# Patient Record
Sex: Female | Born: 1935 | Race: White | Hispanic: No | Smoking: Former smoker
Health system: Southern US, Community
[De-identification: ages and names within clinical notes are randomized; demographics above are authoritative.]

## PROBLEM LIST (undated history)

## (undated) DIAGNOSIS — I4891 Unspecified atrial fibrillation: Secondary | ICD-10-CM

## (undated) DIAGNOSIS — F0390 Unspecified dementia without behavioral disturbance: Secondary | ICD-10-CM

## (undated) DIAGNOSIS — I639 Cerebral infarction, unspecified: Secondary | ICD-10-CM

---

## 2018-01-26 ENCOUNTER — Emergency Department: Payer: Medicare Other

## 2018-01-26 ENCOUNTER — Inpatient Hospital Stay
Admission: EM | Admit: 2018-01-26 | Discharge: 2018-01-30 | DRG: 086 | Disposition: A | Discharge status: Skilled Nursing Facility | Payer: Medicare Other | Source: Skilled Nursing Facility | Attending: Specialist | Admitting: Specialist

## 2018-01-26 ENCOUNTER — Other Ambulatory Visit: Payer: Self-pay

## 2018-01-26 DIAGNOSIS — Z515 Encounter for palliative care: Secondary | ICD-10-CM | POA: Diagnosis not present

## 2018-01-26 DIAGNOSIS — I69351 Hemiplegia and hemiparesis following cerebral infarction affecting right dominant side: Secondary | ICD-10-CM

## 2018-01-26 DIAGNOSIS — F0280 Dementia in other diseases classified elsewhere without behavioral disturbance: Secondary | ICD-10-CM | POA: Diagnosis present

## 2018-01-26 DIAGNOSIS — W1830XA Fall on same level, unspecified, initial encounter: Secondary | ICD-10-CM | POA: Diagnosis present

## 2018-01-26 DIAGNOSIS — G309 Alzheimer's disease, unspecified: Secondary | ICD-10-CM | POA: Diagnosis present

## 2018-01-26 DIAGNOSIS — F419 Anxiety disorder, unspecified: Secondary | ICD-10-CM | POA: Diagnosis present

## 2018-01-26 DIAGNOSIS — Y92129 Unspecified place in nursing home as the place of occurrence of the external cause: Secondary | ICD-10-CM

## 2018-01-26 DIAGNOSIS — E785 Hyperlipidemia, unspecified: Secondary | ICD-10-CM | POA: Diagnosis present

## 2018-01-26 DIAGNOSIS — S06360A Traumatic hemorrhage of cerebrum, unspecified, without loss of consciousness, initial encounter: Principal | ICD-10-CM | POA: Diagnosis present

## 2018-01-26 DIAGNOSIS — S0101XA Laceration without foreign body of scalp, initial encounter: Secondary | ICD-10-CM | POA: Diagnosis present

## 2018-01-26 DIAGNOSIS — E440 Moderate protein-calorie malnutrition: Secondary | ICD-10-CM

## 2018-01-26 DIAGNOSIS — F329 Major depressive disorder, single episode, unspecified: Secondary | ICD-10-CM | POA: Diagnosis present

## 2018-01-26 DIAGNOSIS — I615 Nontraumatic intracerebral hemorrhage, intraventricular: Secondary | ICD-10-CM | POA: Diagnosis not present

## 2018-01-26 DIAGNOSIS — R531 Weakness: Secondary | ICD-10-CM

## 2018-01-26 DIAGNOSIS — I480 Paroxysmal atrial fibrillation: Secondary | ICD-10-CM | POA: Diagnosis present

## 2018-01-26 DIAGNOSIS — Z7901 Long term (current) use of anticoagulants: Secondary | ICD-10-CM

## 2018-01-26 DIAGNOSIS — I10 Essential (primary) hypertension: Secondary | ICD-10-CM | POA: Diagnosis present

## 2018-01-26 DIAGNOSIS — S0990XA Unspecified injury of head, initial encounter: Secondary | ICD-10-CM

## 2018-01-26 DIAGNOSIS — I4891 Unspecified atrial fibrillation: Secondary | ICD-10-CM

## 2018-01-26 DIAGNOSIS — Z66 Do not resuscitate: Secondary | ICD-10-CM | POA: Diagnosis present

## 2018-01-26 DIAGNOSIS — Z79899 Other long term (current) drug therapy: Secondary | ICD-10-CM

## 2018-01-26 DIAGNOSIS — Z7189 Other specified counseling: Secondary | ICD-10-CM | POA: Diagnosis not present

## 2018-01-26 DIAGNOSIS — I619 Nontraumatic intracerebral hemorrhage, unspecified: Secondary | ICD-10-CM | POA: Diagnosis present

## 2018-01-26 DIAGNOSIS — Z7952 Long term (current) use of systemic steroids: Secondary | ICD-10-CM

## 2018-01-26 HISTORY — DX: Unspecified atrial fibrillation: I48.91

## 2018-01-26 HISTORY — DX: Unspecified dementia, unspecified severity, without behavioral disturbance, psychotic disturbance, mood disturbance, and anxiety: F03.90

## 2018-01-26 HISTORY — DX: Cerebral infarction, unspecified: I63.9

## 2018-01-26 LAB — URINALYSIS, COMPLETE (UACMP) WITH MICROSCOPIC
BILIRUBIN URINE: NEGATIVE
Bacteria, UA: NONE SEEN
GLUCOSE, UA: NEGATIVE mg/dL
HGB URINE DIPSTICK: NEGATIVE
KETONES UR: NEGATIVE mg/dL
LEUKOCYTES UA: NEGATIVE
Nitrite: NEGATIVE
Protein, ur: NEGATIVE mg/dL
Specific Gravity, Urine: 1.006 (ref 1.005–1.030)
Squamous Epithelial / LPF: NONE SEEN (ref 0–5)
pH: 7 (ref 5.0–8.0)

## 2018-01-26 LAB — COMPREHENSIVE METABOLIC PANEL
ALT: 19 U/L (ref 0–44)
AST: 26 U/L (ref 15–41)
Albumin: 3.6 g/dL (ref 3.5–5.0)
Alkaline Phosphatase: 95 U/L (ref 38–126)
Anion gap: 10 (ref 5–15)
BUN: 16 mg/dL (ref 8–23)
CHLORIDE: 100 mmol/L (ref 98–111)
CO2: 32 mmol/L (ref 22–32)
Calcium: 9.3 mg/dL (ref 8.9–10.3)
Creatinine, Ser: 0.71 mg/dL (ref 0.44–1.00)
GFR calc non Af Amer: >60 mL/min (ref >60)
Glucose, Bld: 119 mg/dL — ABNORMAL HIGH (ref 70–99)
POTASSIUM: 3.9 mmol/L (ref 3.5–5.1)
SODIUM: 142 mmol/L (ref 135–145)
Total Bilirubin: 0.9 mg/dL (ref 0.3–1.2)
Total Protein: 7 g/dL (ref 6.5–8.1)

## 2018-01-26 LAB — CBC
HCT: 39 % (ref 35.0–47.0)
Hemoglobin: 13.2 g/dL (ref 12.0–16.0)
MCH: 28.8 pg (ref 26.0–34.0)
MCHC: 33.8 g/dL (ref 32.0–36.0)
MCV: 85.2 fL (ref 80.0–100.0)
PLATELETS: 193 10*3/uL (ref 150–440)
RBC: 4.58 MIL/uL (ref 3.80–5.20)
RDW: 14.8 % — AB (ref 11.5–14.5)
WBC: 8.6 10*3/uL (ref 3.6–11.0)

## 2018-01-26 LAB — TROPONIN I: TROPONIN I: 0.03 ng/mL — AB (ref <0.03)

## 2018-01-26 MED ORDER — MEMANTINE HCL 5 MG PO TABS
10.0000 mg | ORAL_TABLET | Freq: Every day | ORAL | Status: DC
  Administered 2018-01-26 – 2018-01-29 (×3): 10 mg via ORAL
  Filled 2018-01-26 (×4): qty 2

## 2018-01-26 MED ORDER — CLONAZEPAM 0.5 MG PO TABS
1.5000 mg | ORAL_TABLET | ORAL | Status: DC
  Filled 2018-01-26: qty 3

## 2018-01-26 MED ORDER — ADULT MULTIVITAMIN W/MINERALS CH: 1.0000 | ORAL_TABLET | Freq: Every day | ORAL | Status: DC

## 2018-01-26 MED ORDER — CITALOPRAM HYDROBROMIDE 10 MG PO TABS
15.0000 mg | ORAL_TABLET | Freq: Every day | ORAL | Status: DC
  Administered 2018-01-26 – 2018-01-30 (×4): 15 mg via ORAL
  Filled 2018-01-26 (×5): qty 2

## 2018-01-26 MED ORDER — ACETAMINOPHEN 650 MG RE SUPP: 650.0000 mg | Freq: Four times a day (QID) | RECTAL | Status: DC | PRN

## 2018-01-26 MED ORDER — DONEPEZIL HCL 5 MG PO TABS
5.0000 mg | ORAL_TABLET | Freq: Every day | ORAL | Status: DC
  Administered 2018-01-26 – 2018-01-29 (×3): 5 mg via ORAL
  Filled 2018-01-26 (×5): qty 1

## 2018-01-26 MED ORDER — METOPROLOL TARTRATE 25 MG PO TABS
25.0000 mg | ORAL_TABLET | Freq: Two times a day (BID) | ORAL | Status: DC
  Administered 2018-01-26 – 2018-01-29 (×5): 25 mg via ORAL
  Filled 2018-01-26 (×8): qty 1

## 2018-01-26 MED ORDER — HYDROCODONE-ACETAMINOPHEN 5-325 MG PO TABS: 1.0000 | ORAL_TABLET | ORAL | Status: DC | PRN

## 2018-01-26 MED ORDER — SODIUM CHLORIDE 0.9% FLUSH: 3.0000 mL | INTRAVENOUS | Status: DC | PRN

## 2018-01-26 MED ORDER — SODIUM CHLORIDE 0.9% FLUSH
3.0000 mL | Freq: Two times a day (BID) | INTRAVENOUS | Status: DC
  Administered 2018-01-27 – 2018-01-30 (×7): 3 mL via INTRAVENOUS

## 2018-01-26 MED ORDER — ACETAMINOPHEN 325 MG PO TABS: 650.0000 mg | ORAL_TABLET | Freq: Four times a day (QID) | ORAL | Status: DC | PRN

## 2018-01-26 MED ORDER — AMMONIUM LACTATE 12 % EX CREA
TOPICAL_CREAM | Freq: Two times a day (BID) | CUTANEOUS | Status: DC
  Filled 2018-01-26: qty 140

## 2018-01-26 MED ORDER — ATORVASTATIN CALCIUM 20 MG PO TABS
40.0000 mg | ORAL_TABLET | Freq: Every day | ORAL | Status: DC
  Administered 2018-01-26 – 2018-01-29 (×3): 40 mg via ORAL
  Filled 2018-01-26 (×4): qty 2

## 2018-01-26 MED ORDER — SODIUM CHLORIDE 0.9% FLUSH
3.0000 mL | Freq: Two times a day (BID) | INTRAVENOUS | Status: DC
  Administered 2018-01-26 – 2018-01-28 (×4): 3 mL via INTRAVENOUS

## 2018-01-26 MED ORDER — DOCUSATE SODIUM 100 MG PO CAPS
100.0000 mg | ORAL_CAPSULE | Freq: Two times a day (BID) | ORAL | Status: DC
  Administered 2018-01-26 – 2018-01-30 (×6): 100 mg via ORAL
  Filled 2018-01-26 (×8): qty 1

## 2018-01-26 MED ORDER — ONDANSETRON HCL 4 MG PO TABS: 4.0000 mg | ORAL_TABLET | Freq: Four times a day (QID) | ORAL | Status: DC | PRN

## 2018-01-26 MED ORDER — TRIAMCINOLONE ACETONIDE 0.1 % EX CREA
1.0000 "application " | TOPICAL_CREAM | Freq: Two times a day (BID) | CUTANEOUS | Status: DC
  Administered 2018-01-26 – 2018-01-29 (×6): 1 via TOPICAL
  Filled 2018-01-26: qty 15

## 2018-01-26 MED ORDER — CALAMINE EX LOTN
1.0000 "application " | TOPICAL_LOTION | CUTANEOUS | Status: DC
  Administered 2018-01-26: 1 via TOPICAL
  Filled 2018-01-26: qty 177

## 2018-01-26 MED ORDER — ADULT MULTIVITAMIN W/MINERALS CH
1.0000 | ORAL_TABLET | Freq: Every day | ORAL | Status: DC
  Administered 2018-01-26 – 2018-01-30 (×4): 1 via ORAL
  Filled 2018-01-26 (×5): qty 1

## 2018-01-26 MED ORDER — SODIUM CHLORIDE 0.9 % IV SOLN
INTRAVENOUS | Status: DC | PRN
  Administered 2018-01-26: 10:00:00 via INTRAVENOUS

## 2018-01-26 MED ORDER — AMMONIUM LACTATE 12 % EX LOTN
TOPICAL_LOTION | Freq: Two times a day (BID) | CUTANEOUS | Status: DC
  Administered 2018-01-26 – 2018-01-29 (×6): via TOPICAL
  Filled 2018-01-26: qty 400

## 2018-01-26 MED ORDER — SODIUM CHLORIDE 0.9 % IV SOLN: 250.0000 mL | INTRAVENOUS | Status: DC | PRN

## 2018-01-26 MED ORDER — PROTHROMBIN COMPLEX CONC HUMAN 1000 UNITS IV KIT
3226.0000 [IU] | PACK | Status: AC
  Administered 2018-01-26: 3226 [IU] via INTRAVENOUS
  Filled 2018-01-26 (×2): qty 3226

## 2018-01-26 MED ORDER — ONDANSETRON HCL 4 MG/2ML IJ SOLN: 4.0000 mg | Freq: Four times a day (QID) | INTRAMUSCULAR | Status: DC | PRN

## 2018-01-26 MED ORDER — FUROSEMIDE 20 MG PO TABS
20.0000 mg | ORAL_TABLET | Freq: Two times a day (BID) | ORAL | Status: DC
  Administered 2018-01-26 – 2018-01-30 (×6): 20 mg via ORAL
  Filled 2018-01-26 (×7): qty 1

## 2018-01-26 MED ORDER — POLYETHYLENE GLYCOL 3350 17 G PO PACK: 17.0000 g | PACK | Freq: Every day | ORAL | Status: DC | PRN

## 2018-01-26 MED ORDER — POTASSIUM CHLORIDE CRYS ER 10 MEQ PO TBCR
10.0000 meq | EXTENDED_RELEASE_TABLET | Freq: Every day | ORAL | Status: DC
  Administered 2018-01-26 – 2018-01-30 (×3): 10 meq via ORAL
  Filled 2018-01-26 (×4): qty 1

## 2018-01-26 MED ORDER — METOPROLOL TARTRATE 5 MG/5ML IV SOLN: 2.5000 mg | Freq: Four times a day (QID) | INTRAVENOUS | Status: DC | PRN

## 2018-01-26 NOTE — ED Provider Notes (Signed)
[facility] Emergency Department Provider Note   ____________________________________________    I have reviewed the triage vital signs and the nursing notes.   HISTORY  Chief Complaint Fall  History severely limited by dementia   HPI Amber Bruce is a 84 y.o. female with a history of dementia who presents after reported fall.  This was apparently unwitnessed.  No further history is available   No past medical history on file.  There are no active problems to display for this patient.     Prior to Admission medications   Medication Sig Start Date End Date Taking? Authorizing Provider  ammonium lactate (AMLACTIN) 12 % cream Apply topically 2 (two) times daily.   Yes [provider]  apixaban (ELIQUIS) 2.5 MG TABS tablet Take 2.5 mg by mouth 2 (two) times daily.   Yes [provider]  atorvastatin (LIPITOR) 40 MG tablet Take 40 mg by mouth at bedtime.   Yes [provider]  calamine lotion Apply 1 application topically 2 (two) times a week. On Monday and Friday; apply to back and arm   Yes [provider]  citalopram (CELEXA) 10 MG tablet Take 15 mg by mouth daily.   Yes [provider]  clonazePAM (KLONOPIN) 0.5 MG tablet Take 1.5 mg by mouth every Monday, Wednesday, and Friday. At 1400 (2 hours prior to showers)   Yes [provider]  donepezil (ARICEPT) 5 MG tablet Take 5 mg by mouth at bedtime.   Yes [provider]  furosemide (LASIX) 20 MG tablet Take 20 mg by mouth 2 (two) times daily. In the morning and afternoon   Yes [provider]  memantine (NAMENDA) 10 MG tablet Take 10 mg by mouth at bedtime.   Yes [provider]  metoprolol tartrate (LOPRESSOR) 25 MG tablet Take 25 mg by mouth every 12 (twelve) hours.   Yes [provider]  Multiple Vitamins-Minerals (CERTAVITE SENIOR/ANTIOXIDANT) TABS Take 1 tablet by mouth daily.   Yes [provider]    potassium chloride (K-DUR) 10 MEQ tablet Take 10 mEq by mouth daily.   Yes [provider]  triamcinolone cream (KENALOG) 0.1 % Apply 1 application topically 2 (two) times daily. Apply to affected areas on back and legs   Yes [provider]     Allergies Bee venom; Lisinopril; and Penicillins  No family history on file.  Social History Limited by dementia  Level 5 caveat: Unable to obtain review of Systems due to dementia     ____________________________________________   PHYSICAL EXAM:  VITAL SIGNS: ED Triage Vitals  Enc Vitals Group     BP 01/26/18 0647 (!) 160/92     Pulse Rate 01/26/18 0647 (!) 105     Resp 01/26/18 0647 16     Temp 01/26/18 0647 (!) 96.2 F (35.7 C)     Temp Source 01/26/18 0647 Axillary     SpO2 01/26/18 0647 96 %     Weight 01/26/18 0651 61.2 kg (135 lb)     Height --      Head Circumference --      Peak Flow --      Pain Score --      Pain Loc --      Pain Edu? --      Excl. in GC? --     Constitutional: Alert  Eyes: Conjunctivae are normal.  Head: Laceration to the posterior scalp, 3.5 cm long, bleeding controlled megaly intact Nose: No  swelling or epistaxis Mouth/Throat: Mucous membranes are moist.   Neck: No vertebral tenderness to palpation Cardiovascular: Tachycardia, regular rhythm.  Good peripheral circulation.  No chest wall tenderness to palpation, no bruising/abrasion Respiratory: Normal respiratory effort.  No retractions. Lungs CTAB. Gastrointestinal: Soft and nontender. No distention.    Musculoskeletal: Normal range of motion, passively, of the lower extremities and upper extremities.  No pain with axial load on both hips.  No pelvic tenderness to palpation.  Clavicles nontender, no vertebral tenderness. warm and well perfused extremities Neurologic: Moves all extremities Skin:  Skin is warm, dry and intact. No rash noted.   ____________________________________________   LABS (all labs ordered are  listed, but only abnormal results are displayed)  Labs Reviewed  CBC - Abnormal; Notable for the following components:      Result Value   RDW 14.8 (*)    All other components within normal limits  COMPREHENSIVE METABOLIC PANEL - Abnormal; Notable for the following components:   Glucose, Bld 119 (*)    All other components within normal limits  TROPONIN I - Abnormal; Notable for the following components:   Troponin I 0.03 (*)    All other components within normal limits  URINALYSIS, COMPLETE (UACMP) WITH MICROSCOPIC - Abnormal; Notable for the following components:   Color, Urine YELLOW (*)    APPearance CLEAR (*)    All other components within normal limits   ____________________________________________  EKG  ED ECG REPORT I, Jene Every, the attending physician, personally viewed and interpreted this ECG.  Date: 01/26/2018  Rate: 108 Rhythm: Appears consistent with atrial fibrillation QRS Axis: normal Intervals: Abnormal ST/T Wave abnormalities: Nonspecific changes   ____________________________________________  RADIOLOGY  Chest x-ray CT head and cervical spine ____________________________________________   PROCEDURES  Procedure(s) performed: yes  .Marland KitchenLaceration Repair Date/Time: 01/26/2018 2:36 PM Performed by: Jene Every, MD Authorized by: Jene Every, MD   Consent:    Consent obtained:  Verbal   Consent given by:  Patient   Risks discussed:  Infection and pain Anesthesia (see MAR for exact dosages):    Anesthesia method:  None Laceration details:    Location:  Scalp   Length (cm):  1 Repair type:    Repair type:  Simple Exploration:    Hemostasis achieved with:  Direct pressure   Wound exploration: entire depth of wound probed and visualized     Contaminated: no   Treatment:    Area cleansed with:  Saline   Amount of cleaning:  Standard   Irrigation solution:  Sterile saline   Visualized foreign bodies/material removed: no   Skin  repair:    Repair method:  Staples   Number of staples:  1 Approximation:    Approximation:  Close Post-procedure details:    Dressing:  Sterile dressing   Patient tolerance of procedure:  Tolerated well, no immediate complications     Critical Care performed: No ____________________________________________   INITIAL IMPRESSION / ASSESSMENT AND PLAN / ED COURSE  Pertinent labs & imaging results that were available during my care of the patient were reviewed by me and considered in my medical decision making (see chart for details).  Patient presents after unwitnessed fall.  No history available secondary to dementia.  Laceration to the scalp.  Tachycardia with mild hypothermia noted.  Will check labs, x-ray, urinalysis, CT head and cervical spine  ----------------------------------------- 8:50 AM on 01/26/2018 -----------------------------------------  Discussed with Dr. Marcell Barlow regarding intraventricular hemorrhage, he recommends family discussion given the patient is on Eliquis.  Recommends [facility] and repeat CT scan in 6 hours discussed with family and they agree with plan   ----------------------------------------- 2:35 PM on 01/26/2018 -----------------------------------------  Repeat CT scan unchanged.  1 cm laceration/hematoma closed with 1 staple Patient however is quite diffusely weak and unsafe for discharge at this time, neurologically intact, discussed with hospitalist for admission     ____________________________________________   FINAL CLINICAL IMPRESSION(S) / ED DIAGNOSES  Final diagnoses:  Injury of head, initial encounter  Intraventricular hemorrhage (HCC)  Generalized weakness  Atrial fibrillation, unspecified type [facility])        Note:  This document was prepared using Dragon voice recognition software and may include unintentional dictation errors.    Jene Every, MD 01/26/18 [phone]

## 2018-01-26 NOTE — ED Notes (Signed)
Pt leaving for CT now.  

## 2018-01-26 NOTE — ED Notes (Addendum)
4th call to floor to give report.

## 2018-01-26 NOTE — ED Notes (Signed)
Reported Trop level to [employer].

## 2018-01-26 NOTE — Progress Notes (Signed)
Pharmacy consult for post K[facility]/Feiba/Praxbind pharmacy monitoring noted. No specific monitoring recommended after Eliquis reversal. Some physicians will follow H&H. Pharmacy will sign off. Please re-consult if needed.  Aniayah Alaniz A. Larimore, [place].D., BCPS Clinical Pharmacist 01/26/2018 12:35

## 2018-01-26 NOTE — ED Notes (Signed)
No 2nd trop drawn per Dr Cyril Loosen

## 2018-01-26 NOTE — ED Notes (Signed)
Tiffany on floor received report.

## 2018-01-26 NOTE — ED Notes (Signed)
2nd attempt at report to floor. Charge states room is dirty so pt will get room 120 instead.

## 2018-01-26 NOTE — Consult Note (Signed)
Neurosurgery note  I was initially called by the ER earlier today regarding this patient, and recommended [facility] and repeating her scan. Based on my discussion with Dr. Cyril Loosen, the plan was to discharge the patient from the ER if the 2nd CT scan was stable. Based on this information, I expected her to be discharged back to her facility.  The patient was ultimately admitted to the hospitalist service.  The 2nd CT scan was stable, with scant intraventricular hemorrhage bilaterally.  No mass effect, midline shift, or any substantive lesion requiring neurosurgical intervention was noted.  Based on the patient history of advanced dementia, stable neurological exam, and family reticence to any invasive procedure, there is no situation in which neurosurgery intervention would be required.    I would not recommend neurosurgical intervention now or at any point based on the information at hand, given the patient's advanced age, medical issues, and dementia.  I defer additional decisions on her need for continued anticoagulation given fall risk to the hospitalist.  I would not recommend antiepileptics or any additional scans.    Please contact me if there are any questions or concerns.  Venetia Night MD Pager [phone]

## 2018-01-26 NOTE — Progress Notes (Signed)
Discussed this patient's situation with neurosurgeon Dr. Myer Haff.  After repeat head CT showed stable minimal bilateral hemorrhage, and given the fact that there would be no reasonable intervention in her case, due to age and baseline functional status and the small size of her bleed, neurosurgery consult was discontinued.  Dr. Myer Haff stated that he was more than happy to see this patient again if there is any change in her situation that warrants intervention or further recommendations on his part.  Kristeen Miss [facility] 01/26/2018, 10:07 PM

## 2018-01-26 NOTE — ED Notes (Signed)
Pt's head wound cleansed with peroxide/NS mix. Tolerated well.

## 2018-01-26 NOTE — ED Notes (Signed)
Pt will not attempt to answer all A&O Q's. Will state her name. Calm and cooperative otherwise. Does not complain of any pain.

## 2018-01-26 NOTE — ED Triage Notes (Signed)
Pt has fall this morning and has a lac to the back of her head. Pt has hx of dementia and will just look at you when asking questions. Unsure how fall occurred.

## 2018-01-26 NOTE — H&P (Signed)
[facility] - [place] at [facility]   PATIENT NAME: Amber Bruce    MR#:  [id number]  DATE OF BIRTH:  10/22/1923  DATE OF ADMISSION:  01/26/2018  PRIMARY CARE PHYSICIAN: Dione Housekeeper, MD   REQUESTING/REFERRING PHYSICIAN:   CHIEF COMPLAINT:   Chief Complaint  Patient presents with  . Fall    HISTORY OF PRESENT ILLNESS: Amber Bruce  is a 84 y.o. female with a known history per below which includes Alzheimer's disease, cerebrovascular accident with right hemiparesis, paroxysmal A. fib on Eliquis, status post unwitnessed fall at nursing facility hitting her head, patient brought into the emergency room for further evaluation/care, noted to have intracranial hemorrhage on CT of the head, patient received [facility] at the request of neurosurgery would repeat head CT which was noted for stable intraventricular hemorrhage, hospitalist asked to admit, patient evaluated at the bedside, patient is poor historian due to dementia, the patient's daughter is at the bedside, all questions answered, patient is now been admitted for acute intraventricular hemorrhage status post unwitnessed fall.  PAST MEDICAL HISTORY:   Dementia, paroxysmal A. fib, cerebrovascular accident with right hemiparesis, hypertension, chronic venous insufficiency  PAST SURGICAL HISTORY:  Joint replacement  SOCIAL HISTORY:  Social History   Tobacco Use  . Smoking status: Not on file  Substance Use Topics  . Alcohol use: Not on file    FAMILY HISTORY:  Diabetes  DRUG ALLERGIES:  Allergies  Allergen Reactions  . Bee Venom   . Lisinopril   . Penicillins     Has patient had a PCN reaction causing immediate rash, facial/tongue/throat swelling, SOB or lightheadedness with hypotension: Unknown Has patient had a PCN reaction causing severe rash involving mucus membranes or skin necrosis: Unknown Has patient had a PCN reaction that required hospitalization: Unknown Has patient had a PCN reaction  occurring within the last 10 years: Unknown If all of the above answers are "NO", then may proceed with Cephalosporin use.    REVIEW OF SYSTEMS: Unable to be obtained given dementia  CONSTITUTIONAL: No fever, fatigue or weakness.  EYES: No blurred or double vision.  EARS, NOSE, AND THROAT: No tinnitus or ear pain.  RESPIRATORY: No cough, shortness of breath, wheezing or hemoptysis.  CARDIOVASCULAR: No chest pain, orthopnea, edema.  GASTROINTESTINAL: No nausea, vomiting, diarrhea or abdominal pain.  GENITOURINARY: No dysuria, hematuria.  ENDOCRINE: No polyuria, nocturia,  HEMATOLOGY: No anemia, easy bruising or bleeding SKIN: No rash or lesion. MUSCULOSKELETAL: No joint pain or arthritis.   NEUROLOGIC: No tingling, numbness, weakness.  PSYCHIATRY: No anxiety or depression.   MEDICATIONS AT HOME:  Prior to Admission medications   Medication Sig Start Date End Date Taking? Authorizing Provider  ammonium lactate (AMLACTIN) 12 % cream Apply topically 2 (two) times daily.   Yes [provider]  apixaban (ELIQUIS) 2.5 MG TABS tablet Take 2.5 mg by mouth 2 (two) times daily.   Yes [provider]  atorvastatin (LIPITOR) 40 MG tablet Take 40 mg by mouth at bedtime.   Yes [provider]  calamine lotion Apply 1 application topically 2 (two) times a week. On Monday and Friday; apply to back and arm   Yes [provider]  citalopram (CELEXA) 10 MG tablet Take 15 mg by mouth daily.   Yes [provider]  clonazePAM (KLONOPIN) 0.5 MG tablet Take 1.5 mg by mouth every Monday, Wednesday, and Friday. At 1400 (2 hours prior to showers)   Yes [provider]  donepezil (ARICEPT) 5  MG tablet Take 5 mg by mouth at bedtime.   Yes [provider]  furosemide (LASIX) 20 MG tablet Take 20 mg by mouth 2 (two) times daily. In the morning and afternoon   Yes [provider]  memantine (NAMENDA) 10 MG tablet Take 10 mg by mouth at bedtime.    Yes [provider]  metoprolol tartrate (LOPRESSOR) 25 MG tablet Take 25 mg by mouth every 12 (twelve) hours.   Yes [provider]  Multiple Vitamins-Minerals (CERTAVITE SENIOR/ANTIOXIDANT) TABS Take 1 tablet by mouth daily.   Yes [provider]  potassium chloride (K-DUR) 10 MEQ tablet Take 10 mEq by mouth daily.   Yes [provider]  triamcinolone cream (KENALOG) 0.1 % Apply 1 application topically 2 (two) times daily. Apply to affected areas on back and legs   Yes [provider]      PHYSICAL EXAMINATION:   VITAL SIGNS: Blood pressure (!) 136/91, pulse 87, temperature (!) 96.2 F (35.7 C), temperature source Axillary, resp. rate 20, weight 61.2 kg, SpO2 97 %.  GENERAL:  84 y.o.-year-old patient lying in the bed with no acute distress.  Frail-appearing EYES: Pupils equal, round, reactive to light and accommodation. No scleral icterus. Extraocular muscles intact.  HEENT: Cephalohematoma with laceration status post stapling, normocephalic. Oropharynx and nasopharynx clear.  NECK:  Supple, no jugular venous distention. No thyroid enlargement, no tenderness.  LUNGS: Normal breath sounds bilaterally, no wheezing, rales,rhonchi or crepitation. No use of accessory muscles of respiration.  CARDIOVASCULAR: S1, S2 normal. No murmurs, rubs, or gallops.  ABDOMEN: Soft, nontender, nondistended. Bowel sounds present. No organomegaly or mass.  EXTREMITIES: No pedal edema, cyanosis, or clubbing.  NEUROLOGIC: Cranial nerves II through XII are intact.  Chronic right hemiparesis, confused and disoriented per baseline   PSYCHIATRIC: The patient is awake, alert, confused and disoriented per baseline  SKIN: No obvious rash, lesion, or ulcer.  Right cephalohematoma, scalp laceration status post stapling  LABORATORY PANEL:   CBC Recent Labs  Lab 01/26/18 0724  WBC 8.6  HGB 13.2  HCT 39.0  PLT 193  MCV 85.2  MCH 28.8  MCHC 33.8  RDW 14.8*    ------------------------------------------------------------------------------------------------------------------  Chemistries  Recent Labs  Lab 01/26/18 0724  NA 142  K 3.9  CL 100  CO2 32  GLUCOSE 119*  BUN 16  CREATININE 0.71  CALCIUM 9.3  AST 26  ALT 19  ALKPHOS 95  BILITOT 0.9   ------------------------------------------------------------------------------------------------------------------ CrCl cannot be calculated (Unknown ideal weight.). ------------------------------------------------------------------------------------------------------------------ No results for input(s): TSH, T4TOTAL, T3FREE, THYROIDAB in the last 72 hours.  Invalid input(s): FREET3   Coagulation profile No results for input(s): INR, PROTIME in the last 168 hours. ------------------------------------------------------------------------------------------------------------------- No results for input(s): DDIMER in the last 72 hours. -------------------------------------------------------------------------------------------------------------------  Cardiac Enzymes Recent Labs  Lab 01/26/18 0724  TROPONINI 0.03*   ------------------------------------------------------------------------------------------------------------------ Invalid input(s): POCBNP  ---------------------------------------------------------------------------------------------------------------  Urinalysis    Component Value Date/Time   COLORURINE YELLOW (A) 01/26/2018 0940   APPEARANCEUR CLEAR (A) 01/26/2018 0940   LABSPEC 1.006 01/26/2018 0940   PHURINE 7.0 01/26/2018 0940   GLUCOSEU NEGATIVE 01/26/2018 0940   HGBUR NEGATIVE 01/26/2018 0940   BILIRUBINUR NEGATIVE 01/26/2018 0940   KETONESUR NEGATIVE 01/26/2018 0940   PROTEINUR NEGATIVE 01/26/2018 0940   NITRITE NEGATIVE 01/26/2018 0940   LEUKOCYTESUR NEGATIVE 01/26/2018 0940     RADIOLOGY: Ct Head Wo Contrast  Result Date: 01/26/2018 CLINICAL DATA:   Recent fall with known intraventricular hemorrhage EXAM: CT HEAD WITHOUT CONTRAST TECHNIQUE:  Contiguous axial images were obtained from the base of the skull through the vertex without intravenous contrast. COMPARISON:  January 26, 2018 study performed earlier in the day. FINDINGS: Brain: Generalized atrophy is stable. There remain areas of hemorrhage in each posterior aspect left lateral ventricle, unchanged. There has been no new or progression of hemorrhage since earlier in the day. There is no mass. There is no extra-axial fluid collection or midline shift. There is no appreciable intraparenchymal hemorrhage. No well-defined hematoma evident. There is stable periventricular small vessel disease in the centra semiovale bilaterally. No acute infarct evident. Vascular: No hyperdense vessel evident. There is calcification in each carotid siphon region as well as slight calcification in distal vertebral arteries. Skull: Bony calvarium appears intact. There is a right parietal scalp hematoma containing foci of air. Sinuses/Orbits: There is mucosal thickening and opacification in several ethmoid air cells. There is opacification in the posterior left sphenoid sinus. Orbits appear symmetric bilaterally. Other: Mastoid air cells are clear. There is debris in each external auditory canal. IMPRESSION: Layering hemorrhage noted in each posterior aspect of the lateral ventricles, stable. No new or increasing foci of hemorrhage. No parenchymal hemorrhage in particular noted. Stable atrophy with periventricular small vessel disease. No acute infarct evident. No extra-axial fluid collection or midline shift. There are foci of arterial vascular calcification. There is a right parietal scalp hematoma containing air. No fracture evident. Foci of paranasal sinus disease noted. There is probable cerumen in each external auditory canal. Electronically Signed   By: Bretta Bang III M.D.   On: 01/26/2018 14:07   Ct Head Wo  Contrast  Result Date: 01/26/2018 CLINICAL DATA:  Head laceration after fall today. EXAM: CT HEAD WITHOUT CONTRAST CT CERVICAL SPINE WITHOUT CONTRAST TECHNIQUE: Multidetector CT imaging of the head and cervical spine was performed following the standard protocol without intravenous contrast. Multiplanar CT image reconstructions of the cervical spine were also generated. COMPARISON:  None. FINDINGS: CT HEAD FINDINGS Brain: Mild diffuse cortical atrophy is noted. Mild chronic ischemic white matter disease is noted. Small amount of blood is noted in the posterior horns of both lateral ventricles. No mass effect or midline shift is noted. No acute infarction or mass lesion is noted. Ventricular size is within normal limits given the degree of surrounding atrophy. Vascular: No hyperdense vessel or unexpected calcification. Skull: Normal. Negative for fracture or focal lesion. Sinuses/Orbits: Mild left sphenoid sinusitis is noted. Other: Small right posterior scalp hematoma is noted. CT CERVICAL SPINE FINDINGS Alignment: Mild grade 1 anterolisthesis of C3-4 and C4-5 is noted secondary to posterior facet joint hypertrophy. Skull base and vertebrae: No acute fracture. No primary bone lesion or focal pathologic process. Soft tissues and spinal canal: No prevertebral fluid or swelling. No visible canal hematoma. Disc levels: Severe degenerative disc disease is noted at C5-6 and C6-7 with anterior osteophyte formation. Upper chest: Negative. Other: Degenerative changes are seen involving posterior facet joints bilaterally. IMPRESSION: Mild diffuse cortical atrophy. Mild chronic ischemic white matter disease. Small right posterior scalp hematoma is noted. Small amount of intraventricular hemorrhage is noted in the posterior horns of both lateral ventricles. Critical Value/emergent results were called by telephone at the time of interpretation on 01/26/2018 at 8:22 am to Dr. Jene Every , who verbally acknowledged these  results. Multilevel degenerative disc disease is noted in the cervical spine. No fracture or other acute abnormality is noted. Electronically Signed   By: Lupita Raider, M.D.   On: 01/26/2018 08:23   Ct Cervical  Spine Wo Contrast  Result Date: 01/26/2018 CLINICAL DATA:  Head laceration after fall today. EXAM: CT HEAD WITHOUT CONTRAST CT CERVICAL SPINE WITHOUT CONTRAST TECHNIQUE: Multidetector CT imaging of the head and cervical spine was performed following the standard protocol without intravenous contrast. Multiplanar CT image reconstructions of the cervical spine were also generated. COMPARISON:  None. FINDINGS: CT HEAD FINDINGS Brain: Mild diffuse cortical atrophy is noted. Mild chronic ischemic white matter disease is noted. Small amount of blood is noted in the posterior horns of both lateral ventricles. No mass effect or midline shift is noted. No acute infarction or mass lesion is noted. Ventricular size is within normal limits given the degree of surrounding atrophy. Vascular: No hyperdense vessel or unexpected calcification. Skull: Normal. Negative for fracture or focal lesion. Sinuses/Orbits: Mild left sphenoid sinusitis is noted. Other: Small right posterior scalp hematoma is noted. CT CERVICAL SPINE FINDINGS Alignment: Mild grade 1 anterolisthesis of C3-4 and C4-5 is noted secondary to posterior facet joint hypertrophy. Skull base and vertebrae: No acute fracture. No primary bone lesion or focal pathologic process. Soft tissues and spinal canal: No prevertebral fluid or swelling. No visible canal hematoma. Disc levels: Severe degenerative disc disease is noted at C5-6 and C6-7 with anterior osteophyte formation. Upper chest: Negative. Other: Degenerative changes are seen involving posterior facet joints bilaterally. IMPRESSION: Mild diffuse cortical atrophy. Mild chronic ischemic white matter disease. Small right posterior scalp hematoma is noted. Small amount of intraventricular hemorrhage is  noted in the posterior horns of both lateral ventricles. Critical Value/emergent results were called by telephone at the time of interpretation on 01/26/2018 at 8:22 am to Dr. Jene Every , who verbally acknowledged these results. Multilevel degenerative disc disease is noted in the cervical spine. No fracture or other acute abnormality is noted. Electronically Signed   By: Lupita Raider, M.D.   On: 01/26/2018 08:23   Dg Chest Port 1 View  Result Date: 01/26/2018 CLINICAL DATA:  Fall and weakness.  History of dementia. EXAM: PORTABLE CHEST 1 VIEW COMPARISON:  None. FINDINGS: The lungs are adequately inflated. The interstitial markings are coarse bilaterally. The cardiac silhouette is enlarged. The pulmonary vascularity is not engorged. There is calcification in the wall of the thoracic aorta. The mediastinum is normal in width. IMPRESSION: Bilateral mild interstitial prominence may reflect acute or chronic bronchitic changes. Cardiomegaly without pulmonary vascular congestion or pulmonary edema. No acute pneumonia. Thoracic aortic atherosclerosis. Electronically Signed   By: David  [place] M.D.   On: 01/26/2018 07:41    EKG: Orders placed or performed during the hospital encounter of 01/26/18  . EKG 12-Lead  . EKG 12-Lead  . EKG 12-Lead  . EKG 12-Lead    IMPRESSION AND PLAN: *Acute intraventricular hemorrhage Status post mechanical fall striking head, received prothrombin complex concentrate Admit to telemetry bed, neurosurgery following, neurochecks per routine, physical therapy/speech therapy to evaluate/treat, aspiration/fall/skin care precautions while in house, avoid antiplatelet/anticoagulant/NSAID medications, and continue close medical monitoring  *Chronic Alzheimer's disease with history of cerebrovascular accident Stable Continue Aricept, Namenda  *History of paroxysmal A. Fib Eliquis discontinued given intracranial hemorrhage Continue Lopressor  *History of cerebrovascular  accident with right hemiparesis Continue statin therapy  *Chronic benign essential hypertension Stable Continue current regiment  All the records are reviewed and case discussed with ED provider. Management plans discussed with the patient, family and they are in agreement.  CODE STATUS:full    TOTAL TIME TAKING CARE OF THIS PATIENT: 40 minutes.    Montell D Salary M.D  on 01/26/2018   Between 7am to 6pm - Pager - [phone]  After 6pm go to [email or web address] - password [email or web address]  [facility]  Office  [phone]  CC: Primary care physician; Dione Housekeeper, MD   Note: This dictation was prepared with Dragon dictation along with smaller phrase technology. Any transcriptional errors that result from this process are unintentional.

## 2018-01-26 NOTE — Progress Notes (Signed)
Family Meeting Note  Advance Directive:yes  Today a meeting took place with the Patient, daughter.  Patient is unable to participate due [id number] capacity dementia   The following clinical team members were present during this meeting:MD  The following were discussed:Patient's diagnosis: ICH, dementia, Patient's progosis: Unable to determine and Goals for treatment: Full Code  Additional follow-up to be provided: prn  Time spent during discussion:20 minutes  Bertrum Sol, MD

## 2018-01-27 DIAGNOSIS — Z515 Encounter for palliative care: Secondary | ICD-10-CM

## 2018-01-27 DIAGNOSIS — S0990XA Unspecified injury of head, initial encounter: Secondary | ICD-10-CM

## 2018-01-27 DIAGNOSIS — R531 Weakness: Secondary | ICD-10-CM

## 2018-01-27 DIAGNOSIS — I615 Nontraumatic intracerebral hemorrhage, intraventricular: Secondary | ICD-10-CM

## 2018-01-27 DIAGNOSIS — E440 Moderate protein-calorie malnutrition: Secondary | ICD-10-CM

## 2018-01-27 DIAGNOSIS — I4891 Unspecified atrial fibrillation: Secondary | ICD-10-CM

## 2018-01-27 DIAGNOSIS — Z7189 Other specified counseling: Secondary | ICD-10-CM

## 2018-01-27 DIAGNOSIS — Z66 Do not resuscitate: Secondary | ICD-10-CM

## 2018-01-27 MED ORDER — CLONAZEPAM 0.5 MG PO TABS: 1.5000 mg | ORAL_TABLET | Freq: Two times a day (BID) | ORAL | Status: DC | PRN

## 2018-01-27 MED ORDER — ENSURE ENLIVE PO LIQD
237.0000 mL | Freq: Two times a day (BID) | ORAL | Status: DC
  Administered 2018-01-27 – 2018-01-30 (×4): 237 mL via ORAL

## 2018-01-27 MED ORDER — CLONAZEPAM 0.5 MG PO TABS
1.5000 mg | ORAL_TABLET | Freq: Every day | ORAL | Status: DC | PRN
  Administered 2018-01-30: 14:00:00 1.5 mg via ORAL
  Filled 2018-01-27: qty 3

## 2018-01-27 NOTE — Progress Notes (Signed)
Amber Bruce, daughter, medical power of attorney does not want the patient taking the Clonazepam.  This medication is given at assisted living if needed for bathing.  It is scheduled on MWF for showers only.  Ms. Colon Branch is adamant about the patient not taking this medication.

## 2018-01-27 NOTE — Clinical Social Work Note (Signed)
Clinical Social Work Assessment  Patient Details  Name: Amber Bruce MRN: [id number] Date of Birth: 12-Dec-1923  Date of referral:  01/27/18               Reason for consult:  Facility Placement                Permission sought to share information with:  Chartered certified accountant granted to share information::  Yes, Verbal Permission Granted  Name::      Amber Bruce::   [name]   Relationship::     Contact Information:     Housing/Transportation Living arrangements for the past 2 months:  [place] of Information:  Adult Children Patient Interpreter Needed:  None Criminal Activity/Legal Involvement Pertinent to Current Situation/Hospitalization:  No - Comment as needed Significant Relationships:  Adult Children Lives with:  Facility Resident Do you feel safe going back to the place where you live?    Need for family participation in patient care:  Yes (Comment)  Care giving concerns:  Patient is a resident at [place].    Social Worker assessment / plan:  Holiday representative (CSW) reviewed chart and noted that patient is from [place]. CSW attempted to contact [facility] twice today and left a Advertising account executive for [employer] the Retail buyer and the Special educational needs teacher. PT is recommending SNF. Palliative care is recommending outpatient palliative. CSW met with patient and her daughter Amber Bruce [phone] and patient's son in law Amber Bruce were at bedside. Patient was asleep during assessment. Per daughter patient has been in the memory care unit for 11 months and has been at [facility] a total of 4.5 years. Per daughter patient was able to walk with a walker at baseline. CSW explained that patient will need a higher level of care like a SNF. CSW explained that medicare will pay for SNF if patient has a 3 night qualifying inpatient stay in the hospital and can  participate in PT. Patient was admitted to inpatient 01/26/18. Per daughter patient has long term care insurance as well. Per daughter patient has been to [facility] for rehab in 2016 and asked CSW to send a referral there. Daughter is agreeable to SNF search in [place]. FL2 complete and faxed out to [facility]. CSW will continue to follow and assist as needed.   Employment status:  Disabled (Comment on whether or not currently receiving Disability) Insurance information:  Medicare PT Recommendations:  [place] / Referral to community resources:  [place]  Patient/Family's Response to care:  Patient's daughter is agreeable to [employer].   Patient/Family's Understanding of and Emotional Response to Diagnosis, Current Treatment, and Prognosis:  Patient's daughter was very pleasant and thanked CSW for assistance.   Emotional Assessment Appearance:  Appears stated age Attitude/Demeanor/Rapport:  Unable to Assess Affect (typically observed):  Unable to Assess Orientation:  Oriented to Self, Fluctuating Orientation (Suspected and/or reported Sundowners) Alcohol / Substance use:  Not Applicable Psych involvement (Current and /or in the community):  No (Comment)  Discharge Needs  Concerns to be addressed:  Discharge Planning Concerns Readmission within the last 30 days:  No Current discharge risk:  Dependent with Mobility, Cognitively Impaired, Chronically ill Barriers to Discharge:  Continued Medical Work up   [employer], Veronia Beets, LCSW 01/27/2018, 5:13 PM

## 2018-01-27 NOTE — Progress Notes (Signed)
Initial Nutrition Assessment  DOCUMENTATION CODES:   Non-severe (moderate) malnutrition in context of chronic illness  INTERVENTION:   - Liberalize diet to Regular  - Continue MVI with minerals daily  NUTRITION DIAGNOSIS:   Moderate Malnutrition related to chronic illness (Alzheimer's disease) as evidenced by mild fat depletion, moderate fat depletion, mild muscle depletion, moderate muscle depletion.  GOAL:   Patient will meet greater than or equal to 90% of their needs  MONITOR:   PO intake, Labs, Weight trends, I & O's  REASON FOR ASSESSMENT:   Consult Assessment of nutrition requirement/status  ASSESSMENT:   84 year old female who presented to the ED on 10/3 with a laceration to her scalp after a mechanical fall. PMH significant for Alzheimer's disease, CVA with right hemiparesis, atrial fibrillation on Eliquis, and hypertension. CT of the head showed intracranial hemorrhage. Neurosurgical intervention was not recommended.  Spoke with pt's daughter at bedside. Pt was sleeping but awakened to voice and during NFPE. All of diet and weight history were obtained from pt's daughter.  Pt's daughter is concerned that pt's throat is dry which is why she was not able to eat much breakfast. Per pt's daughter, pt ate most of the scrambled eggs, a bite of toast, and all of the juice.  Pt's daughter reports that pt typically "eats good" and will eat anything. Pt resides in a memory care unit at a facility where she receives 3 meals daily. Pt's daughter is unsure how well pt eats at meals but knows that whenever she and her sister take the pt out to eat, pt eats 100% of entree and 100% of dessert.  Pt's daughter states that she has noticed that pt has been gradually losing weight over the past several months but is unsure of her UBW. Weight history in chart is limited. No weights recorded PTA.  Pt's daughter agrees with liberalizing pt's diet so that she is not restricted. Discussed  with MD who agrees. Pt does not like oral nutrition supplements. Per daughter, pt will not drink supplements if offered.  Pt's daughter denies pt having any issues chewing or swallowing.  Meal Completion: 25%  Medications reviewed and include: Colace 100 mg BID, Lasix 20 mg BID, MVI with minerals daily, K-dur 10 mEq daily  Labs reviewed.  NUTRITION - FOCUSED PHYSICAL EXAM:    Most Recent Value  Orbital Region  Mild depletion  Upper Arm Region  No depletion  Thoracic and Lumbar Region  Moderate depletion  Buccal Region  No depletion  Temple Region  Mild depletion  Clavicle Bone Region  Moderate depletion  Clavicle and Acromion Bone Region  Moderate depletion  Scapular Bone Region  Unable to assess  Dorsal Hand  Moderate depletion  Patellar Region  Mild depletion  Anterior Thigh Region  Mild depletion  Posterior Calf Region  Mild depletion  Edema (RD Assessment)  None  Hair  Reviewed  Eyes  Reviewed  Mouth  Reviewed  Skin  Reviewed  Nails  Reviewed       Diet Order:   Diet Order            Diet regular Room service appropriate? Yes; Fluid consistency: Thin  Diet effective now              EDUCATION NEEDS:   No education needs have been identified at this time  Skin:  Skin Assessment: Reviewed RN Assessment (abrasion to buttocks, skin tear to head)  Last BM:  unknown/PTA  Height:   Ht Readings from  Last 1 Encounters:  01/26/18 5' (1.524 m)    Weight:   Wt Readings from Last 1 Encounters:  01/26/18 55.7 kg    Ideal Body Weight:  45.45 kg  BMI:  Body mass index is 23.98 kg/m.  Estimated Nutritional Needs:   Kcal:  1400-1600  Protein:  70-85 grams  Fluid:  >/= 1.5 L    Earma Reading, MS, RD, LDN Inpatient Clinical Dietitian Pager: [phone] Weekend/After Hours: [phone]

## 2018-01-27 NOTE — Progress Notes (Signed)
[employer] - [place] [facility]   PATIENT NAME: Amber Bruce    MR#:  [id number]  DATE OF BIRTH:  1923-10-16  SUBJECTIVE:  CHIEF COMPLAINT:   Chief Complaint  Patient presents with  . Fall   -Patient is alert and pleasantly confused.  Family at bedside.  REVIEW OF SYSTEMS:  Review of Systems  Unable to perform ROS: Dementia    DRUG ALLERGIES:   Allergies  Allergen Reactions  . Bee Venom   . Lisinopril   . Penicillins     Has patient had a PCN reaction causing immediate rash, facial/tongue/throat swelling, SOB or lightheadedness with hypotension: Unknown Has patient had a PCN reaction causing severe rash involving mucus membranes or skin necrosis: Unknown Has patient had a PCN reaction that required hospitalization: Unknown Has patient had a PCN reaction occurring within the last 10 years: Unknown If all of the above answers are "NO", then may proceed with Cephalosporin use.    VITALS:  Blood pressure (!) 120/94, pulse 80, temperature 98.6 F (37 C), temperature source Oral, resp. rate 20, height 5' (1.524 m), weight 55.7 kg, SpO2 98 %.  PHYSICAL EXAMINATION:  Physical Exam  GENERAL:  84 y.o.-year-old patient lying in the bed with no acute distress.  EYES: Pupils equal, round, reactive to light and accommodation. No scleral icterus. Extraocular muscles intact.  HEENT: Head atraumatic, normocephalic. Oropharynx and nasopharynx clear.  NECK:  Supple, no jugular venous distention. No thyroid enlargement, no tenderness.  LUNGS: Normal breath sounds bilaterally, no wheezing, rales,rhonchi or crepitation. No use of accessory muscles of respiration. Decreased at the bases CARDIOVASCULAR: S1, S2 normal. No rubs, or gallops. 3/6 systolic murmur present ABDOMEN: Soft, nontender, nondistended. Bowel sounds present. No organomegaly or mass.  EXTREMITIES: No  cyanosis, or clubbing. 1+ pedal edema noted. NEUROLOGIC: Cranial nerves intact. Moving left sided  extremities in bed. Weak on right side.  sensation intact. Gait not checked.  PSYCHIATRIC: The patient is alert, not very conversational SKIN: No obvious rash, lesion, or ulcer.    LABORATORY PANEL:   CBC Recent Labs  Lab 01/26/18 0724  WBC 8.6  HGB 13.2  HCT 39.0  PLT 193   ------------------------------------------------------------------------------------------------------------------  Chemistries  Recent Labs  Lab 01/26/18 0724  NA 142  K 3.9  CL 100  CO2 32  GLUCOSE 119*  BUN 16  CREATININE 0.71  CALCIUM 9.3  AST 26  ALT 19  ALKPHOS 95  BILITOT 0.9   ------------------------------------------------------------------------------------------------------------------  Cardiac Enzymes Recent Labs  Lab 01/26/18 0724  TROPONINI 0.03*   ------------------------------------------------------------------------------------------------------------------  RADIOLOGY:  Ct Head Wo Contrast  Result Date: 01/26/2018 CLINICAL DATA:  Recent fall with known intraventricular hemorrhage EXAM: CT HEAD WITHOUT CONTRAST TECHNIQUE: Contiguous axial images were obtained from the base of the skull through the vertex without intravenous contrast. COMPARISON:  January 26, 2018 study performed earlier in the day. FINDINGS: Brain: Generalized atrophy is stable. There remain areas of hemorrhage in each posterior aspect left lateral ventricle, unchanged. There has been no new or progression of hemorrhage since earlier in the day. There is no mass. There is no extra-axial fluid collection or midline shift. There is no appreciable intraparenchymal hemorrhage. No well-defined hematoma evident. There is stable periventricular small vessel disease in the centra semiovale bilaterally. No acute infarct evident. Vascular: No hyperdense vessel evident. There is calcification in each carotid siphon region as well as slight calcification in distal vertebral arteries. Skull: Bony calvarium appears intact. There  is a right parietal  scalp hematoma containing foci of air. Sinuses/Orbits: There is mucosal thickening and opacification in several ethmoid air cells. There is opacification in the posterior left sphenoid sinus. Orbits appear symmetric bilaterally. Other: Mastoid air cells are clear. There is debris in each external auditory canal. IMPRESSION: Layering hemorrhage noted in each posterior aspect of the lateral ventricles, stable. No new or increasing foci of hemorrhage. No parenchymal hemorrhage in particular noted. Stable atrophy with periventricular small vessel disease. No acute infarct evident. No extra-axial fluid collection or midline shift. There are foci of arterial vascular calcification. There is a right parietal scalp hematoma containing air. No fracture evident. Foci of paranasal sinus disease noted. There is probable cerumen in each external auditory canal. Electronically Signed   By: Bretta Bang III M.D.   On: 01/26/2018 14:07   Ct Head Wo Contrast  Result Date: 01/26/2018 CLINICAL DATA:  Head laceration after fall today. EXAM: CT HEAD WITHOUT CONTRAST CT CERVICAL SPINE WITHOUT CONTRAST TECHNIQUE: Multidetector CT imaging of the head and cervical spine was performed following the standard protocol without intravenous contrast. Multiplanar CT image reconstructions of the cervical spine were also generated. COMPARISON:  None. FINDINGS: CT HEAD FINDINGS Brain: Mild diffuse cortical atrophy is noted. Mild chronic ischemic white matter disease is noted. Small amount of blood is noted in the posterior horns of both lateral ventricles. No mass effect or midline shift is noted. No acute infarction or mass lesion is noted. Ventricular size is within normal limits given the degree of surrounding atrophy. Vascular: No hyperdense vessel or unexpected calcification. Skull: Normal. Negative for fracture or focal lesion. Sinuses/Orbits: Mild left sphenoid sinusitis is noted. Other: Small right posterior scalp  hematoma is noted. CT CERVICAL SPINE FINDINGS Alignment: Mild grade 1 anterolisthesis of C3-4 and C4-5 is noted secondary to posterior facet joint hypertrophy. Skull base and vertebrae: No acute fracture. No primary bone lesion or focal pathologic process. Soft tissues and spinal canal: No prevertebral fluid or swelling. No visible canal hematoma. Disc levels: Severe degenerative disc disease is noted at C5-6 and C6-7 with anterior osteophyte formation. Upper chest: Negative. Other: Degenerative changes are seen involving posterior facet joints bilaterally. IMPRESSION: Mild diffuse cortical atrophy. Mild chronic ischemic white matter disease. Small right posterior scalp hematoma is noted. Small amount of intraventricular hemorrhage is noted in the posterior horns of both lateral ventricles. Critical Value/emergent results were called by telephone at the time of interpretation on 01/26/2018 at 8:22 am to Dr. Jene Every , who verbally acknowledged these results. Multilevel degenerative disc disease is noted in the cervical spine. No fracture or other acute abnormality is noted. Electronically Signed   By: Lupita Raider, M.D.   On: 01/26/2018 08:23   Ct Cervical Spine Wo Contrast  Result Date: 01/26/2018 CLINICAL DATA:  Head laceration after fall today. EXAM: CT HEAD WITHOUT CONTRAST CT CERVICAL SPINE WITHOUT CONTRAST TECHNIQUE: Multidetector CT imaging of the head and cervical spine was performed following the standard protocol without intravenous contrast. Multiplanar CT image reconstructions of the cervical spine were also generated. COMPARISON:  None. FINDINGS: CT HEAD FINDINGS Brain: Mild diffuse cortical atrophy is noted. Mild chronic ischemic white matter disease is noted. Small amount of blood is noted in the posterior horns of both lateral ventricles. No mass effect or midline shift is noted. No acute infarction or mass lesion is noted. Ventricular size is within normal limits given the degree of  surrounding atrophy. Vascular: No hyperdense vessel or unexpected calcification. Skull: Normal. Negative for fracture or  focal lesion. Sinuses/Orbits: Mild left sphenoid sinusitis is noted. Other: Small right posterior scalp hematoma is noted. CT CERVICAL SPINE FINDINGS Alignment: Mild grade 1 anterolisthesis of C3-4 and C4-5 is noted secondary to posterior facet joint hypertrophy. Skull base and vertebrae: No acute fracture. No primary bone lesion or focal pathologic process. Soft tissues and spinal canal: No prevertebral fluid or swelling. No visible canal hematoma. Disc levels: Severe degenerative disc disease is noted at C5-6 and C6-7 with anterior osteophyte formation. Upper chest: Negative. Other: Degenerative changes are seen involving posterior facet joints bilaterally. IMPRESSION: Mild diffuse cortical atrophy. Mild chronic ischemic white matter disease. Small right posterior scalp hematoma is noted. Small amount of intraventricular hemorrhage is noted in the posterior horns of both lateral ventricles. Critical Value/emergent results were called by telephone at the time of interpretation on 01/26/2018 at 8:22 am to Dr. Jene Every , who verbally acknowledged these results. Multilevel degenerative disc disease is noted in the cervical spine. No fracture or other acute abnormality is noted. Electronically Signed   By: Lupita Raider, M.D.   On: 01/26/2018 08:23   Dg Chest Port 1 View  Result Date: 01/26/2018 CLINICAL DATA:  Fall and weakness.  History of dementia. EXAM: PORTABLE CHEST 1 VIEW COMPARISON:  None. FINDINGS: The lungs are adequately inflated. The interstitial markings are coarse bilaterally. The cardiac silhouette is enlarged. The pulmonary vascularity is not engorged. There is calcification in the wall of the thoracic aorta. The mediastinum is normal in width. IMPRESSION: Bilateral mild interstitial prominence may reflect acute or chronic bronchitic changes. Cardiomegaly without pulmonary  vascular congestion or pulmonary edema. No acute pneumonia. Thoracic aortic atherosclerosis. Electronically Signed   By: David  [place] M.D.   On: 01/26/2018 07:41    EKG:   Orders placed or performed during the hospital encounter of 01/26/18  . EKG 12-Lead  . EKG 12-Lead  . EKG 12-Lead  . EKG 12-Lead    ASSESSMENT AND PLAN:   84 year old female from [facility] assisted living facility with past medical history significant for CVA, Alzheimer's dementia, paroxysmal A. fib on Eliquis, hypertension resents after a fall and noted to have intracranial hemorrhage.  1. Intracranial hemorrhage- bilateral lateral ventricles hemorrhage, after a fall and head trauma while on eliquis -Stable, no significant changes mental status wise -Appreciate neurosurgery input.  No intervention recommended -Repeat CT later today. -Physical therapy consult  2.  Alzheimer's disease-from assisted living facility.  Continue Aricept and Namenda  3.  Paroxysmal A. fib-continue Lopressor. -Eliquis discontinued due to intracranial hemorrhage  4.  DVT prophylaxis-teds and SCDs  Family updated at bedside.  Appreciate palliative care consult.  Plan is to discharge back to assisted living facility when stable   All the records are reviewed and case discussed with Care Management/Social Workerr. Management plans discussed with the patient, family and they are in agreement.  CODE STATUS: DNR  TOTAL TIME TAKING CARE OF THIS PATIENT: 37 minutes.   POSSIBLE D/C IN 1-2 DAYS, DEPENDING ON CLINICAL CONDITION.   Deslyn Cavenaugh M.D on 01/27/2018 at 2:50 PM  Between 7am to 6pm - Pager - [phone]  After 6pm go to [email or web address] - Social research officer, government  [facility]  Office  [phone]  CC: Primary care physician; Dione Housekeeper, MD
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
## 2018-01-27 NOTE — Plan of Care (Signed)

## 2018-01-27 NOTE — [place] FL2 (Signed)
[place] MEDICAID FL2 LEVEL OF CARE SCREENING TOOL     IDENTIFICATION  Patient Name: Amber Bruce Birthdate: [birth year] Sex: female Admission Date (Current Location): 01/26/2018  [place] and [place] Number:  Chiropodist and Address:  [facility], [address], [place], [place] [address]      Provider Number: [id number]  Attending Physician Name and Address:  Enid Baas, MD  Relative Name and Phone Number:       Current Level of Care: Hospital Recommended Level of Care: Skilled Nursing Facility Prior Approval Number:    Date Approved/Denied:   PASRR Number: [id number])  Discharge Plan: SNF    Current Diagnoses: Patient Active Problem List   Diagnosis Date Noted  . Malnutrition of moderate degree 01/27/2018  . ICH (intracerebral hemorrhage) ([facility]) 01/26/2018    Orientation RESPIRATION BLADDER Height & Weight     Self  Normal Continent Weight: 122 lb 12.7 oz (55.7 kg) Height:  5' (152.4 cm)  BEHAVIORAL SYMPTOMS/MOOD NEUROLOGICAL BOWEL NUTRITION STATUS      Continent Diet(Diet: Regular )  AMBULATORY STATUS COMMUNICATION OF NEEDS Skin   Extensive Assist Verbally Normal                       Personal Care Assistance Level of Assistance  Bathing, Feeding, Dressing Bathing Assistance: Limited assistance Feeding assistance: Limited assistance Dressing Assistance: Limited assistance     Functional Limitations Info  Sight, Hearing, Speech Sight Info: Adequate Hearing Info: Adequate Speech Info: Adequate    SPECIAL CARE FACTORS FREQUENCY  PT (By licensed PT), OT (By licensed OT)     PT Frequency: (5) OT Frequency: (5)            Contractures      Additional Factors Info  Code Status, Allergies Code Status Info: (DNR ) Allergies Info: (Bee Venom, Lisinopril, Penicillins)           Current Medications (01/27/2018):  This is the current hospital active medication list Current Facility-Administered  Medications  Medication Dose Route Frequency Provider Last Rate Last Dose  . 0.9 %  sodium chloride infusion   Intravenous PRN Jene Every, MD   Stopped at 01/26/18 1343  . 0.9 %  sodium chloride infusion  250 mL Intravenous PRN Salary, Montell D, MD      . acetaminophen (TYLENOL) tablet 650 mg  650 mg Oral Q6H PRN Salary, Montell D, MD       Or  . acetaminophen (TYLENOL) suppository 650 mg  650 mg Rectal Q6H PRN Salary, Montell D, MD      . ammonium lactate (LAC-HYDRIN) 12 % lotion   Topical BID Salary, Montell D, MD      . atorvastatin (LIPITOR) tablet 40 mg  40 mg Oral QHS Salary, Jetty Duhamel D, MD   40 mg at 01/26/18 2008  . calamine lotion 1 application  1 application Topical Once per day on Mon Thu Salary, Montell D, MD   1 application at 01/26/18 2007  . citalopram (CELEXA) tablet 15 mg  15 mg Oral Daily Salary, Montell D, MD   15 mg at 01/27/18 0950  . clonazePAM (KLONOPIN) tablet 1.5 mg  1.5 mg Oral Daily PRN Enid Baas, MD      . docusate sodium (COLACE) capsule 100 mg  100 mg Oral BID Angelina Ok D, MD   100 mg at 01/27/18 0950  . donepezil (ARICEPT) tablet 5 mg  5 mg Oral QHS Salary, Evelena Asa, MD  5 mg at 01/26/18 2009  . feeding supplement (ENSURE ENLIVE) (ENSURE ENLIVE) liquid 237 mL  237 mL Oral BID BM Enid Baas, MD   237 mL at 01/27/18 1443  . furosemide (LASIX) tablet 20 mg  20 mg Oral BID Angelina Ok D, MD   20 mg at 01/27/18 0950  . HYDROcodone-acetaminophen (NORCO/VICODIN) 5-325 MG per tablet 1-2 tablet  1-2 tablet Oral Q4H PRN Salary, Montell D, MD      . memantine (NAMENDA) tablet 10 mg  10 mg Oral QHS Salary, Montell D, MD   10 mg at 01/26/18 2008  . metoprolol tartrate (LOPRESSOR) injection 2.5 mg  2.5 mg Intravenous Q6H PRN Salary, Montell D, MD      . metoprolol tartrate (LOPRESSOR) tablet 25 mg  25 mg Oral Q12H Salary, Montell D, MD   25 mg at 01/27/18 0950  . multivitamin with minerals tablet 1 tablet  1 tablet Oral Daily Salary, Montell D, MD    1 tablet at 01/27/18 0950  . ondansetron (ZOFRAN) tablet 4 mg  4 mg Oral Q6H PRN Salary, Montell D, MD       Or  . ondansetron (ZOFRAN) injection 4 mg  4 mg Intravenous Q6H PRN Salary, Montell D, MD      . polyethylene glycol (MIRALAX / GLYCOLAX) packet 17 g  17 g Oral Daily PRN Salary, Montell D, MD      . potassium chloride (K-DUR,KLOR-CON) CR tablet 10 mEq  10 mEq Oral Daily Salary, Montell D, MD   10 mEq at 01/27/18 0950  . sodium chloride flush (NS) 0.9 % injection 3 mL  3 mL Intravenous Q12H Salary, Montell D, MD   3 mL at 01/27/18 0957  . sodium chloride flush (NS) 0.9 % injection 3 mL  3 mL Intravenous Q12H Salary, Montell D, MD   3 mL at 01/27/18 0956  . sodium chloride flush (NS) 0.9 % injection 3 mL  3 mL Intravenous PRN Salary, Montell D, MD      . triamcinolone cream (KENALOG) 0.1 % 1 application  1 application Topical BID Salary, Evelena Asa, MD   1 application at 01/27/18 1610     Discharge Medications: Please see discharge summary for a list of discharge medications.  Relevant Imaging Results:  Relevant Lab Results:   Additional Information (SSN: [id number])  Crisol Muecke, Darleen Crocker, LCSW

## 2018-01-27 NOTE — Progress Notes (Signed)
Physical Therapy Evaluation Patient Details Name: Amber Bruce MRN: [id number] DOB: 29-May-1923 Today's Date: 01/27/2018   History of Present Illness  On 01/26/2018 pt admitted to hospital for intracerebral hemorrhage s/p a fall. As of 10/4 per neurologist imaging shows hemorrhage is stable, with scant intraventricular hemorrhage bilaterally.  No mass effect, midline shift, or any substantive lesion requiring neurosurgical intervention was noted. Pt has PMH induling Alzheimer's diease, CVA with R hemiparesis, paroxysmal A-fib, HTN, and chronic venous insufficiency.  Clinical Impression  Pt is a pleasant 84 year old female who was admitted for intracerebral hemorrhage. Pt has dementia and is oriented only to person, hx obtained from daughter. Pt agreed to supine ther-ex and tolerated well. Pt performs bed mobility with Min A for rolling and Mod A for long sit. Pt refused participation in other mobility after multiple attempts by PT to encourage participation. Pt demonstrates deficits with cognition, strength, mobility, and balance. Pt is not at her baseline. Would benefit from skilled PT to address above deficits and promote optimal return to PLOF. Recommend OT consult for pt's return to PLOF with ADLs.      Follow Up Recommendations SNF    Equipment Recommendations  None recommended by PT    Recommendations for Other Services OT consult     Precautions / Restrictions Precautions Precautions: Fall Restrictions Weight Bearing Restrictions: No      Mobility  Bed Mobility Overal bed mobility: Needs Assistance Bed Mobility: Rolling Rolling: Min assist-Mod A         General bed mobility comments: Pt demonstrates greater ability roll onto her R side compared to L likely d/t hemiparesis on the R. She requires a lot of coaxing to participate. Also came to long sitting from supine with Mod A for trunk control. Did not progress to EOB d/t pt's refusal.  Transfers                  General transfer comment: Deferred to next tx d/t pt refusal to participate in activity.  Ambulation/Gait                Stairs            Wheelchair Mobility    Modified Rankin (Stroke Patients Only)       Balance Overall balance assessment: Needs assistance Sitting-balance support: Bilateral upper extremity supported;Feet supported Sitting balance-Leahy Scale: Poor Sitting balance - Comments: In long sit has dec trunk control. Postural control: Right lateral lean                                   Pertinent Vitals/Pain Pain Assessment: No/denies pain    Home Living Family/patient expects to be discharged to:: Skilled nursing facility               Home Equipment: Dan Humphreys - 2 wheels Additional Comments: Per daughter Macarthur Critchley) would like [facility].     Prior Function Level of Independence: Needs assistance   Gait / Transfers Assistance Needed: Mod I with mobility using RW for UE support for household distances.  ADL's / Homemaking Assistance Needed:  She require assist with dressing and grooming.  Comments: All hx obtained from pt's daughter d/t pt's cognitive status.     Hand Dominance        Extremity/Trunk Assessment   Upper Extremity Assessment Upper Extremity Assessment: Generalized weakness(RUE 2-/5, LUE 3/5)    Lower Extremity Assessment Lower Extremity Assessment: Generalized  weakness(BLE 3/5)       Communication   Communication: HOH  Cognition Arousal/Alertness: Awake/alert Behavior During Therapy: WFL for tasks assessed/performed Overall Cognitive Status: History of cognitive impairments - at baseline                                 General Comments: Pt is A&O x 1 (person). She is not combative or impulsive, but a little argumentative likely d/t being in an unfamilar surroundings.      General Comments      Exercises Other Exercises Other Exercises: Supine AROM ankle pumps, SLRs, hip  ABD/ADD, and heel slides. All ther-ex performed 5-8 reps with heavy VC for sequencing.   Assessment/Plan    PT Assessment Patient needs continued PT services  PT Problem List Decreased strength;Decreased activity tolerance;Decreased balance;Decreased mobility;Decreased cognition;Decreased knowledge of use of DME;Decreased safety awareness       PT Treatment Interventions DME instruction;Gait training;Functional mobility training;Therapeutic activities;Therapeutic exercise;Balance training;Neuromuscular re-education;Cognitive remediation;Patient/family education    PT Goals (Current goals can be found in the Care Plan section)  Acute Rehab PT Goals PT Goal Formulation: Patient unable to participate in goal setting Time For Goal Achievement: 02/10/18 Potential to Achieve Goals: Fair Additional Goals Additional Goal #1: Pt will demonstrate the ability to perform bed mobility and transfers with Min A to improve independence with mobility in the home.    Frequency Min 2X/week   Barriers to discharge        Co-evaluation               AM-PAC PT "6 Clicks" Daily Activity  Outcome Measure Difficulty turning over in bed (including adjusting bedclothes, sheets and blankets)?: Unable Difficulty moving from lying on back to sitting on the side of the bed? : Unable Difficulty sitting down on and standing up from a chair with arms (e.g., wheelchair, bedside commode, etc,.)?: Unable Help needed moving to and from a bed to chair (including a wheelchair)?: Total Help needed walking in hospital room?: Total Help needed climbing 3-5 steps with a railing? : Total 6 Click Score: 6    End of Session   Activity Tolerance: Patient tolerated treatment well;Other (comment)(Cognitive status limited ability to progress.) Patient left: in bed;with call bell/phone within reach;with bed alarm set;with family/visitor present;with nursing/sitter in room Nurse Communication: Mobility status PT Visit  Diagnosis: Unsteadiness on feet (R26.81);Muscle weakness (generalized) (M62.81);History of falling (Z91.81);Hemiplegia and hemiparesis Hemiplegia - Right/Left: Right Hemiplegia - caused by: Cerebral infarction    Time: 8295-6213 PT Time Calculation (min) (ACUTE ONLY): 24 min   Charges:              Arvilla Meres, SPT   Arvilla Meres 01/27/2018, 1:02 PM

## 2018-01-27 NOTE — Clinical Social Work Placement (Signed)
   CLINICAL SOCIAL WORK PLACEMENT  NOTE  Date:  01/27/2018  Patient Details  Name: Amber Bruce MRN: [id number] Date of Birth: 05/09/1923  Clinical Social Work is seeking post-discharge placement for this patient at the Skilled  Nursing Facility level of care (*CSW will initial, date and re-position this form in  chart as items are completed):  Yes   Patient/family provided with [facility] Clinical Social Work Department's list of facilities offering this level of care within the geographic area requested by the patient (or if unable, by the patient's family).  Yes   Patient/family informed of their freedom to choose among providers that offer the needed level of care, that participate in Medicare, Medicaid or managed care program needed by the patient, have an available bed and are willing to accept the patient.  Yes   Patient/family informed of [facility] ownership interest in [facility] and [facility], as well as of the fact that they are under no obligation to receive care at these facilities.  PASRR submitted to EDS on       PASRR number received on       Existing PASRR number confirmed on 01/27/18     FL2 transmitted to all facilities in geographic area requested by pt/family on 01/27/18     FL2 transmitted to all facilities within larger geographic area on       Patient informed that his/her managed care company has contracts with or will negotiate with certain facilities, including the following:            Patient/family informed of bed offers received.  Patient chooses bed at       Physician recommends and patient chooses bed at      Patient to be transferred to   on  .  Patient to be transferred to facility by       Patient family notified on   of transfer.  Name of family member notified:        PHYSICIAN       Additional Comment:    _______________________________________________ Hezzie Karim, Darleen Crocker, LCSW 01/27/2018, 5:12 PM

## 2018-01-28 LAB — BASIC METABOLIC PANEL
Anion gap: 8 (ref 5–15)
BUN: 14 mg/dL (ref 8–23)
CO2: 31 mmol/L (ref 22–32)
CREATININE: 0.73 mg/dL (ref 0.44–1.00)
Calcium: 8.7 mg/dL — ABNORMAL LOW (ref 8.9–10.3)
Chloride: 101 mmol/L (ref 98–111)
GFR calc Af Amer: >60 mL/min (ref >60)
GFR calc non Af Amer: >60 mL/min (ref >60)
Glucose, Bld: 126 mg/dL — ABNORMAL HIGH (ref 70–99)
POTASSIUM: 3.5 mmol/L (ref 3.5–5.1)
Sodium: 140 mmol/L (ref 135–145)

## 2018-01-28 LAB — CBC
HCT: 38.6 % (ref 35.0–47.0)
Hemoglobin: 12.4 g/dL (ref 12.0–16.0)
MCH: 27.7 pg (ref 26.0–34.0)
MCHC: 32.2 g/dL (ref 32.0–36.0)
MCV: 85.8 fL (ref 80.0–100.0)
Platelets: 174 10*3/uL (ref 150–440)
RBC: 4.49 MIL/uL (ref 3.80–5.20)
RDW: 14.6 % — ABNORMAL HIGH (ref 11.5–14.5)
WBC: 9.9 10*3/uL (ref 3.6–11.0)

## 2018-01-28 MED ORDER — DILTIAZEM HCL 30 MG PO TABS
30.0000 mg | ORAL_TABLET | Freq: Three times a day (TID) | ORAL | Status: DC
  Administered 2018-01-28 – 2018-01-30 (×3): 30 mg via ORAL
  Filled 2018-01-28 (×4): qty 1

## 2018-01-28 NOTE — Progress Notes (Signed)
PT Cancellation Note  Patient Details Name: Amber Bruce MRN: [id number] DOB: 07/14/1923   Cancelled Treatment:    Reason Eval/Treat Not Completed: Patient declined, no reason specified   Pt in bed, eyes open upon arrival.  Pt offered and encouraged session but stated "No" and closed her eyes.  Education and encouragement provided but she continued to decline therapy interventions and encouragement to get up to recliner at bedside.   Danielle Dess 01/28/2018, 10:26 AM

## 2018-01-28 NOTE — Progress Notes (Signed)
OT Cancellation Note  Patient Details Name: Amber Bruce MRN: [id number] DOB: [birth year]   Cancelled Treatment:    Reason Eval/Treat Not Completed: Fatigue/lethargy limiting ability to participate;Patient declined, no reason specified Met with pt sound asleep, opened eyes saying "go away" despite significant prompting. Will check back on pt this PM if schedule permits.  Zenovia Jarred, MSOT, OTR/L Behavioral Health OT/ Acute Relief OT ASCOM: [phone]  Zenovia Jarred 01/28/2018, 9:32 AM

## 2018-01-28 NOTE — Progress Notes (Signed)
[facility] - [place] [facility]   PATIENT NAME: Amber Bruce    MR#:  [id number]  DATE OF BIRTH:  04-Sep-1923  SUBJECTIVE:   Pt. Here due to intracranial bleed.  Awakes to verbal stimuli but does not follow commands.    REVIEW OF SYSTEMS:  Review of Systems  Unable to perform ROS: Dementia    DRUG ALLERGIES:   Allergies  Allergen Reactions  . Bee Venom   . Lisinopril   . Penicillins     Has patient had a PCN reaction causing immediate rash, facial/tongue/throat swelling, SOB or lightheadedness with hypotension: Unknown Has patient had a PCN reaction causing severe rash involving mucus membranes or skin necrosis: Unknown Has patient had a PCN reaction that required hospitalization: Unknown Has patient had a PCN reaction occurring within the last 10 years: Unknown If all of the above answers are "NO", then may proceed with Cephalosporin use.    VITALS:  Blood pressure (!) 108/94, pulse 68, temperature 98.6 F (37 C), temperature source Oral, resp. rate 20, height 5' (1.524 m), weight 55.7 kg, SpO2 97 %.  PHYSICAL EXAMINATION:  Physical Exam  GENERAL:  84 y.o.-year-old patient lying in the bed with no acute distress.  EYES: Pupils equal, round, reactive to light and accommodation. No scleral icterus. Extraocular muscles intact.  HEENT: Head atraumatic, normocephalic. Oropharynx and nasopharynx clear.  NECK:  Supple, no jugular venous distention. No thyroid enlargement, no tenderness.  LUNGS: Normal breath sounds bilaterally, no wheezing, rales,rhonchi or crepitation. No use of accessory muscles of respiration. Decreased at the bases CARDIOVASCULAR: S1, S2 normal. No rubs, or gallops. 3/6 systolic murmur present ABDOMEN: Soft, nontender, nondistended. Bowel sounds present. No organomegaly or mass.  EXTREMITIES: No  cyanosis, or clubbing. 1+ pedal edema noted. NEUROLOGIC: Cranial nerves intact. No focal motor or sensory deficits b/l.  Globally weak.    PSYCHIATRIC: The patient is alert, not very conversational SKIN: No obvious rash, lesion, or ulcer.    LABORATORY PANEL:   CBC Recent Labs  Lab 01/28/18 0427  WBC 9.9  HGB 12.4  HCT 38.6  PLT 174   ------------------------------------------------------------------------------------------------------------------  Chemistries  Recent Labs  Lab 01/26/18 0724 01/28/18 0427  NA 142 140  K 3.9 3.5  CL 100 101  CO2 32 31  GLUCOSE 119* 126*  BUN 16 14  CREATININE 0.71 0.73  CALCIUM 9.3 8.7*  AST 26  --   ALT 19  --   ALKPHOS 95  --   BILITOT 0.9  --    ------------------------------------------------------------------------------------------------------------------  Cardiac Enzymes Recent Labs  Lab 01/26/18 0724  TROPONINI 0.03*   ------------------------------------------------------------------------------------------------------------------  RADIOLOGY:  Ct Head Wo Contrast  Result Date: 01/26/2018 CLINICAL DATA:  Recent fall with known intraventricular hemorrhage EXAM: CT HEAD WITHOUT CONTRAST TECHNIQUE: Contiguous axial images were obtained from the base of the skull through the vertex without intravenous contrast. COMPARISON:  January 26, 2018 study performed earlier in the day. FINDINGS: Brain: Generalized atrophy is stable. There remain areas of hemorrhage in each posterior aspect left lateral ventricle, unchanged. There has been no new or progression of hemorrhage since earlier in the day. There is no mass. There is no extra-axial fluid collection or midline shift. There is no appreciable intraparenchymal hemorrhage. No well-defined hematoma evident. There is stable periventricular small vessel disease in the centra semiovale bilaterally. No acute infarct evident. Vascular: No hyperdense vessel evident. There is calcification in each carotid siphon region as well as slight calcification in distal vertebral arteries.  Skull: Bony calvarium appears intact. There is a  right parietal scalp hematoma containing foci of air. Sinuses/Orbits: There is mucosal thickening and opacification in several ethmoid air cells. There is opacification in the posterior left sphenoid sinus. Orbits appear symmetric bilaterally. Other: Mastoid air cells are clear. There is debris in each external auditory canal. IMPRESSION: Layering hemorrhage noted in each posterior aspect of the lateral ventricles, stable. No new or increasing foci of hemorrhage. No parenchymal hemorrhage in particular noted. Stable atrophy with periventricular small vessel disease. No acute infarct evident. No extra-axial fluid collection or midline shift. There are foci of arterial vascular calcification. There is a right parietal scalp hematoma containing air. No fracture evident. Foci of paranasal sinus disease noted. There is probable cerumen in each external auditory canal. Electronically Signed   By: Bretta Bang III M.D.   On: 01/26/2018 14:07    EKG:   Orders placed or performed during the hospital encounter of 01/26/18  . EKG 12-Lead  . EKG 12-Lead  . EKG 12-Lead  . EKG 12-Lead    ASSESSMENT AND PLAN:   84 year old female from [facility] assisted living facility with past medical history significant for CVA, Alzheimer's dementia, paroxysmal A. fib on Eliquis, hypertension resents after a fall and noted to have intracranial hemorrhage.  1. Intracranial hemorrhage- bilateral lateral ventricles hemorrhage, after a fall and head trauma while on eliquis - Stable, remains intermittently lethargic at times.   - Appreciate neurosurgery input.  No intervention recommended - await PT consult.   2.  Alzheimer's disease-from assisted living facility.  Continue Aricept and Namenda - may need higher level or care as per physical therapy.   3.  Paroxysmal A. fib-continue Lopressor, Cardizem.   -Eliquis discontinued due to intracranial hemorrhage  4. Anxiety/Depression - cont. Celexa, Klonopin.   5.  Hyperlipidemia - cont. Atorvastatin  Patient cannot go back to her assisted living and will need a higher level of care and probably skilled nursing facility.  Social work aware.  If patient's mental status does not improve and her p.o. intake is not improving would need to consider hospice services at the facility.  Palliative care is following.  All the records are reviewed and case discussed with Care Management/Social Workerr. Management plans discussed with the patient, family and they are in agreement.  CODE STATUS: DNR  TOTAL TIME TAKING CARE OF THIS PATIENT: 30 minutes.   POSSIBLE D/C IN 1-2 DAYS, DEPENDING ON CLINICAL CONDITION.   Houston Siren M.D on 01/28/2018 at 1:58 PM  Between 7am to 6pm - Pager - [phone]  After 6pm go to [email or web address] - Social research officer, government  [facility]  Office  [phone]  CC: Primary care physician; Dione Housekeeper, MD

## 2018-01-29 NOTE — Progress Notes (Signed)
MD aware pt is unable to take morning medications. No new orders given, Will continue to monitor.

## 2018-01-29 NOTE — Progress Notes (Signed)
Pt alert to voice, disoriented x4. Pt unable to follow commands, Pt unable to take night time medications. MD aware. Will continue to monitor

## 2018-01-29 NOTE — Progress Notes (Signed)
[employer] - [place] at [facility]   PATIENT NAME: Amber Bruce    MR#:  [id number]  DATE OF BIRTH:  1923-09-30  SUBJECTIVE:   Much more awake and cooperative today. Took her pills in apple sauce and also drinking some Ensure today.    REVIEW OF SYSTEMS:  Review of Systems  Unable to perform ROS: Dementia    DRUG ALLERGIES:   Allergies  Allergen Reactions  . Bee Venom   . Lisinopril   . Penicillins     Has patient had a PCN reaction causing immediate rash, facial/tongue/throat swelling, SOB or lightheadedness with hypotension: Unknown Has patient had a PCN reaction causing severe rash involving mucus membranes or skin necrosis: Unknown Has patient had a PCN reaction that required hospitalization: Unknown Has patient had a PCN reaction occurring within the last 10 years: Unknown If all of the above answers are "NO", then may proceed with Cephalosporin use.    VITALS:  Blood pressure 124/74, pulse 75, temperature 98.1 F (36.7 C), temperature source Oral, resp. rate 16, height 5' (1.524 m), weight 55.7 kg, SpO2 99 %.  PHYSICAL EXAMINATION:  Physical Exam  GENERAL:  84 y.o.-year-old patient lying in the bed in no acute distress.  EYES: Pupils equal, round, reactive to light and accommodation. No scleral icterus. Extraocular muscles intact.  HEENT: Head atraumatic, normocephalic. Oropharynx and nasopharynx clear.  NECK:  Supple, no jugular venous distention. No thyroid enlargement, no tenderness.  LUNGS: Normal breath sounds bilaterally, no wheezing, rales,rhonchi or crepitation. No use of accessory muscles of respiration.  CARDIOVASCULAR: S1, S2 normal. No rubs, or gallops. 3/6 systolic murmur present ABDOMEN: Soft, nontender, nondistended. Bowel sounds present. No organomegaly or mass.  EXTREMITIES: No  cyanosis, or clubbing. 1+ pedal edema noted. NEUROLOGIC: Cranial nerves intact. No focal motor or sensory deficits b/l.  Globally weak.  PSYCHIATRIC:  The patient is alert, not very conversational SKIN: No obvious rash, lesion, or ulcer.    LABORATORY PANEL:   CBC Recent Labs  Lab 01/28/18 0427  WBC 9.9  HGB 12.4  HCT 38.6  PLT 174   ------------------------------------------------------------------------------------------------------------------  Chemistries  Recent Labs  Lab 01/26/18 0724 01/28/18 0427  NA 142 140  K 3.9 3.5  CL 100 101  CO2 32 31  GLUCOSE 119* 126*  BUN 16 14  CREATININE 0.71 0.73  CALCIUM 9.3 8.7*  AST 26  --   ALT 19  --   ALKPHOS 95  --   BILITOT 0.9  --    ------------------------------------------------------------------------------------------------------------------  Cardiac Enzymes Recent Labs  Lab 01/26/18 0724  TROPONINI 0.03*   ------------------------------------------------------------------------------------------------------------------  RADIOLOGY:  No results found.   ASSESSMENT AND PLAN:   84 year old female from [facility] assisted living facility with past medical history significant for CVA, Alzheimer's dementia, paroxysmal A. fib on Eliquis, hypertension resents after a fall and noted to have intracranial hemorrhage.  1. Intracranial hemorrhage- bilateral lateral ventricles hemorrhage, after a fall and head trauma while on eliquis - Stable, much more awake today.   - Appreciate neurosurgery input.  No intervention recommended - cont. PT as tolerated and eval noted.   2.  Alzheimer's disease-from assisted living facility.  Continue Aricept and Namenda - may need higher level or care as per physical therapy and social working on placement to SNF/STR.   3.  Paroxysmal A. fib-continue Lopressor, Cardizem.   -Eliquis discontinued due to intracranial hemorrhage  4. Anxiety/Depression - cont. Celexa, Klonopin.   5. Hyperlipidemia - cont. Atorvastatin  Patient cannot go back to her assisted living and will need a higher level of care and probably skilled nursing  facility.  Social work aware.   Updated Daughter over the phone on the plan of care.   All the records are reviewed and case discussed with Care Management/Social Workerr. Management plans discussed with the patient, family and they are in agreement.  CODE STATUS: DNR  TOTAL TIME TAKING CARE OF THIS PATIENT: 82 minutes.   POSSIBLE D/C IN 1-2 DAYS, DEPENDING ON CLINICAL CONDITION.   [place] Siren M.D on 01/29/2018 at 12:05 PM  Between 7am to 6pm - Pager - [phone]  After 6pm go to [email or web address] - Social research officer, government  [facility]  Office  [phone]  CC: Primary care physician; Dione Housekeeper, MD

## 2018-01-30 MED ORDER — ENSURE ENLIVE PO LIQD: 237.0000 mL | Freq: Two times a day (BID) | ORAL | 12 refills | Status: AC

## 2018-01-30 NOTE — Care Management Important Message (Signed)
Copy of signed IM left with patient in room.  

## 2018-01-30 NOTE — Progress Notes (Signed)
New referral for outpatient Palliative to follow at [facility] received from CSW [place]. Patient to discharge today. Patient information faxed to referral. Dayna Barker RN, BSN, [facility] and [facility] [place], hospital Liaison [phone]

## 2018-01-30 NOTE — Clinical Social Work Note (Signed)
CSW spoke with patient's daughter Geanie Cooley at bedside. CSW gave bed offers and daughter chose [facility]. CSW contacted Rick at [facility] and accepted bed for patient. CSW also explained that patient should be discharged today and [facility] states that patient can come today. CSW explained to patient's daughter that patient should discharge today and daughter is in agreement. CSW will send all discharge paperwork to [facility] via [facility]. Patient will be transported by EMS. RN to call report and call for transport.   Ruthe Mannan MSW, [address] [phone]

## 2018-01-30 NOTE — Discharge Summary (Addendum)
[employer] [facility]   PATIENT NAME: Amber Bruce    MR#:  [id number]  DATE OF BIRTH:  1923-06-07  DATE OF ADMISSION:  01/26/2018 ADMITTING PHYSICIAN: Bertrum Sol, MD  DATE OF DISCHARGE: 01/30/2018  PRIMARY CARE PHYSICIAN: Dione Housekeeper, MD    ADMISSION DIAGNOSIS:  Generalized weakness [R53.1] Intraventricular hemorrhage (HCC) [I61.5] Injury of head, initial encounter [S09.90XA] Atrial fibrillation, unspecified type (HCC) [I48.91]  DISCHARGE DIAGNOSIS:  Active Problems:   ICH (intracerebral hemorrhage) (HCC)   Malnutrition of moderate degree   SECONDARY DIAGNOSIS:   Past Medical History:  Diagnosis Date  . A-fib (HCC)   . Dementia (HCC)   . Stroke [facility])     HOSPITAL COURSE:   84 year old female from [facility] assisted living facility with past medical history significant for CVA, Alzheimer's dementia, paroxysmal A. fib on Eliquis, hypertension resents after a fall and noted to have intracranial hemorrhage.  1. Intracranial hemorrhage- bilateral lateral ventricles hemorrhage, after a fall and head trauma while on eliquis -Patient has remained clinically stable, no evidence of worsening neurologic compromise.  Seen by neurosurgery and no plans for surgical intervention.  Patient has been taken off Eliquis indefinitely now. - Seen by physical therapy and the recommend short-term rehab and patient is being discharged at presently.  2.  Alzheimer's disease-from assisted living facility.  -Secondary to her recent fall and now with intracranial bleeding patient needs higher level of care and therefore is being discharged to a skilled nursing facility. -She will continue her Aricept and Namenda.  3.  Paroxysmal A. fib-patient will continue her Lopressor.  Remains rate controlled.  Taken off Eliquis indefinitely due to recent fall, high fall risk and intracranial bleeding.  4. Anxiety/Depression - she will cont. Celexa, Klonopin.    5. Hyperlipidemia - she will cont. Atorvastatin  Palliative should cont. To follow the patient at the facility.  If not improving pt. May need transfer to hospice.   DISCHARGE CONDITIONS:   Stable.   CONSULTS OBTAINED:  Treatment Team:  Venetia Night, MD  DRUG ALLERGIES:   Allergies  Allergen Reactions  . Bee Venom   . Lisinopril   . Penicillins     Has patient had a PCN reaction causing immediate rash, facial/tongue/throat swelling, SOB or lightheadedness with hypotension: Unknown Has patient had a PCN reaction causing severe rash involving mucus membranes or skin necrosis: Unknown Has patient had a PCN reaction that required hospitalization: Unknown Has patient had a PCN reaction occurring within the last 10 years: Unknown If all of the above answers are "NO", then may proceed with Cephalosporin use.    DISCHARGE MEDICATIONS:   Allergies as of 01/30/2018      Reactions   Bee Venom    Lisinopril    Penicillins    Has patient had a PCN reaction causing immediate rash, facial/tongue/throat swelling, SOB or lightheadedness with hypotension: Unknown Has patient had a PCN reaction causing severe rash involving mucus membranes or skin necrosis: Unknown Has patient had a PCN reaction that required hospitalization: Unknown Has patient had a PCN reaction occurring within the last 10 years: Unknown If all of the above answers are "NO", then may proceed with Cephalosporin use.      Medication List    STOP taking these medications   apixaban 2.5 MG Tabs tablet Commonly known as:  ELIQUIS     TAKE these medications   ammonium lactate 12 % cream Commonly known as:  AMLACTIN Apply topically 2 (two)  times daily.   atorvastatin 40 MG tablet Commonly known as:  LIPITOR Take 40 mg by mouth at bedtime.   calamine lotion Apply 1 application topically 2 (two) times a week. On Monday and Friday; apply to back and arm   CERTAVITE SENIOR/ANTIOXIDANT Tabs Take 1 tablet by  mouth daily.   citalopram 10 MG tablet Commonly known as:  CELEXA Take 15 mg by mouth daily.   clonazePAM 0.5 MG tablet Commonly known as:  KLONOPIN Take 1.5 mg by mouth every Monday, Wednesday, and Friday. At 1400 (2 hours prior to showers)   donepezil 5 MG tablet Commonly known as:  ARICEPT Take 5 mg by mouth at bedtime.   feeding supplement (ENSURE ENLIVE) Liqd Take 237 mLs by mouth 2 (two) times daily between meals.   furosemide 20 MG tablet Commonly known as:  LASIX Take 20 mg by mouth 2 (two) times daily. In the morning and afternoon   memantine 10 MG tablet Commonly known as:  NAMENDA Take 10 mg by mouth at bedtime.   metoprolol tartrate 25 MG tablet Commonly known as:  LOPRESSOR Take 25 mg by mouth every 12 (twelve) hours.   potassium chloride 10 MEQ tablet Commonly known as:  K-DUR Take 10 mEq by mouth daily.   triamcinolone cream 0.1 % Commonly known as:  KENALOG Apply 1 application topically 2 (two) times daily. Apply to affected areas on back and legs         DISCHARGE INSTRUCTIONS:   DIET:  Regular diet  DISCHARGE CONDITION:  Stable  ACTIVITY:  Activity as tolerated  OXYGEN:  Home Oxygen: No.   Oxygen Delivery: room air  DISCHARGE LOCATION:  nursing home   If you experience worsening of your admission symptoms, develop shortness of breath, life threatening emergency, suicidal or homicidal thoughts you must seek medical attention immediately by calling 911 or calling your MD immediately  if symptoms less severe.  You Must read complete instructions/literature along with all the possible adverse reactions/side effects for all the Medicines you take and that have been prescribed to you. Take any new Medicines after you have completely understood and accpet all the possible adverse reactions/side effects.   Please note  You were cared for by a hospitalist during your hospital stay. If you have any questions about your discharge medications  or the care you received while you were in the hospital after you are discharged, you can call the unit and asked to speak with the hospitalist on call if the hospitalist that took care of you is not available. Once you are discharged, your primary care physician will handle any further medical issues. Please note that NO REFILLS for any discharge medications will be authorized once you are discharged, as it is imperative that you return to your primary care physician (or establish a relationship with a primary care physician if you do not have one) for your aftercare needs so that they can reassess your need for medications and monitor your lab values.     Today   No acute events overnight.  Patient tolerating p.o. well.  More awake and alert.  Will discharge to short-term rehab today.  VITAL SIGNS:  Blood pressure (!) 117/47, pulse 85, temperature 98.2 F (36.8 C), temperature source Oral, resp. rate 16, height 5' (1.524 m), weight 55.7 kg, SpO2 94 %.  I/O:    Intake/Output Summary (Last 24 hours) at 01/30/2018 1341 Last data filed at 01/29/2018 2237 Gross per 24 hour  Intake 240 ml  Output 1300 ml  Net -1060 ml    PHYSICAL EXAMINATION:   GENERAL:  84 y.o.-year-old patient lying in the bed in no acute distress.  EYES: Pupils equal, round, reactive to light and accommodation. No scleral icterus. Extraocular muscles intact.  HEENT: Head atraumatic, normocephalic. Oropharynx and nasopharynx clear.  NECK:  Supple, no jugular venous distention. No thyroid enlargement, no tenderness.  LUNGS: Normal breath sounds bilaterally, no wheezing, rales,rhonchi or crepitation. No use of accessory muscles of respiration.  CARDIOVASCULAR: S1, S2 normal. No rubs, or gallops, or murmurs.  ABDOMEN: Soft, nontender, nondistended. Bowel sounds present. No organomegaly or mass.  EXTREMITIES: No  cyanosis, or clubbing. 1+ pedal edema noted. NEUROLOGIC: Cranial nerves intact. Right upper & Lower Ext.  Weakness due to intracranial bleed. Globally weak.  PSYCHIATRIC: The patient is alert, and follow simple commands.  SKIN: No obvious rash, lesion, or ulcer.   DATA REVIEW:   CBC Recent Labs  Lab 01/28/18 0427  WBC 9.9  HGB 12.4  HCT 38.6  PLT 174    Chemistries  Recent Labs  Lab 01/26/18 0724 01/28/18 0427  NA 142 140  K 3.9 3.5  CL 100 101  CO2 32 31  GLUCOSE 119* 126*  BUN 16 14  CREATININE 0.71 0.73  CALCIUM 9.3 8.7*  AST 26  --   ALT 19  --   ALKPHOS 95  --   BILITOT 0.9  --     Cardiac Enzymes Recent Labs  Lab 01/26/18 0724  TROPONINI 0.03*    Microbiology Results  No results found for this or any previous visit.  RADIOLOGY:  No results found.    Management plans discussed with the patient, family and they are in agreement.  CODE STATUS:     Code Status Orders  (From admission, onward)         Start     Ordered   01/27/18 1251  Do not attempt resuscitation (DNR)  Continuous    Question Answer Comment  In the event of cardiac or respiratory ARREST Do not call a "code blue"   In the event of cardiac or respiratory ARREST Do not perform Intubation, CPR, defibrillation or ACLS   In the event of cardiac or respiratory ARREST Use medication by any route, position, wound care, and other measures to relive pain and suffering. May use oxygen, suction and manual treatment of airway obstruction as needed for comfort.      01/27/18 1250         TOTAL TIME TAKING CARE OF THIS PATIENT: 40 minutes.    Houston Siren M.D on 01/30/2018 at 1:41 PM  Between 7am to 6pm - Pager - [phone]  After 6pm go to [email or web address] - Scientist, research (life sciences) [facility]  Office  [phone]  CC: Primary care physician; Dione Housekeeper, MD

## 2018-01-30 NOTE — Care Plan (Signed)
Called in report to Tammy RN at [place] and answered all questions.  EMS picked patient up, Torrie Mayers RN

## 2018-01-30 NOTE — Progress Notes (Signed)
Pt A&Ox1 with confusion. Pt snacked throughout the night and complied with medications, without difficulty. VS stable. Will continue to monitor.

## 2018-01-30 NOTE — Progress Notes (Signed)
OT Cancellation Note  Patient Details Name: Amber Bruce MRN: [id number] DOB: January 22, 1924   Cancelled Treatment:    Reason Eval/Treat Not Completed: Other (comment). Order received, chart reviewed. Pt with RN for medication. Pt also eating breakfast. RN requesting OT come back at later time. Will re-attempt OT evaluation at later date/time as pt is available and medically appropriate.   Richrd Prime, MPH, MS, OTR/L ascom [phone] 01/30/18, 9:30 AM

## 2018-01-30 NOTE — Progress Notes (Signed)
Daily Progress Note   Patient Name: Amber Bruce       Date: 01/30/2018 DOB: 15-Aug-1923  Age: 84 y.o. MRN#: [id number] Attending Physician: Houston Siren, MD Primary Care Physician: Dione Housekeeper, MD Admit Date: 01/26/2018  Reason for Consultation/Follow-up: Establishing goals of care  Subjective: Patient is sitting up right in bed. She was able to tolerate breakfast and feed herself. Patient is awake and cooperative. She is alert to self only. Denies pain or discomfort.   Daughter is at the bedside. She confirms the goal is for patient to be discharged to SNF with PT/OT. Daughter verbalizes she is concerned that her mother is not moving her right arm much and has not had PT as of yet today. Advised that she should be seen by the end of the day today if she is not discharged. Discussed with daughter that patient history also identified that patient had some right sided weakness due to previous CVA, however she is concerned that she is now not using it at all were before she would use some. Patient does have weak grip and able to hold arm up briefly with some support. Denies any pain to back, shoulder, hand, or arm when touching or providing ROM. Daughter made aware that OT has made an attempt to see her today and plans to return later due to patient eating breakfast earlier. Daughter, Amber Bruce verbalized understanding.   I also re-educated daughter in regards to patient's current condition, co-morbidities, and trajectory. Also the possibility that patient may not return to previous baseline activity despite rehabilitation attempts. She verbalized awareness that patient is weak, however she remains hopeful for continuous improvement, given patient is eating, drinking, and more awake and alert  since admission.   She also confirms patient to have outpatient palliative and DNR/DNI.   Chart Reviewed.   Length of Stay: 4  Current Medications: Scheduled Meds:  . ammonium lactate   Topical BID  . atorvastatin  40 mg Oral QHS  . calamine  1 application Topical Once per day on Mon Thu  . citalopram  15 mg Oral Daily  . diltiazem  30 mg Oral Q8H  . docusate sodium  100 mg Oral BID  . donepezil  5 mg Oral QHS  . feeding supplement (ENSURE ENLIVE)  237 mL Oral BID BM  .  furosemide  20 mg Oral BID  . memantine  10 mg Oral QHS  . metoprolol tartrate  25 mg Oral Q12H  . multivitamin with minerals  1 tablet Oral Daily  . potassium chloride  10 mEq Oral Daily  . sodium chloride flush  3 mL Intravenous Q12H  . triamcinolone cream  1 application Topical BID    Continuous Infusions:   PRN Meds: acetaminophen **OR** acetaminophen, clonazePAM, HYDROcodone-acetaminophen, metoprolol tartrate, ondansetron **OR** ondansetron (ZOFRAN) IV, polyethylene glycol  Physical Exam  Constitutional: Vital signs are normal. She is cooperative. She appears ill.  Chronically ill, thin appearing   Cardiovascular: Normal rate, regular rhythm, normal heart sounds and normal pulses.  Pulmonary/Chest: Effort normal. She has decreased breath sounds.  Abdominal: Soft. Normal appearance and bowel sounds are normal.  Musculoskeletal:  Generalized weakness, right side weakness hx of CVA  Neurological: She is alert. She is disoriented.  Hx of dementia, alert to self   Skin: Skin is warm and dry. Bruising noted.  Thin, left head abrasion s/p fall on admission (clean and dry)   Psychiatric: Cognition and memory are impaired. She expresses inappropriate judgment.  Nursing note and vitals reviewed.           Vital Signs: BP (!) 117/47 (BP Location: Left Arm)   Pulse 85   Temp 98.2 F (36.8 C) (Oral)   Resp 17   Ht 5' (1.524 m)   Wt 55.7 kg   SpO2 94%   BMI 23.98 kg/m  SpO2: SpO2: 94 % O2 Device: O2  Device: Room Air O2 Flow Rate:    Intake/output summary:   Intake/Output Summary (Last 24 hours) at 01/30/2018 1208 Last data filed at 01/29/2018 2237 Gross per 24 hour  Intake 240 ml  Output 1300 ml  Net -1060 ml   LBM: Last BM Date: (prior to admission; unknown date) Baseline Weight: Weight: 61.2 kg Most recent weight: Weight: 55.7 kg      Palliative Assessment/Data: PPS 30%   Flowsheet Rows     Most Recent Value  Intake Tab  Referral Department  Hospitalist  Unit at Time of Referral  ER  Palliative Care Primary Diagnosis  Neurology  Date Notified  01/26/18  Palliative Care Type  New Palliative care  Reason for referral  Clarify Goals of Care  Date of Admission  01/26/18  Date first seen by Palliative Care  01/27/18  # of days Palliative referral response time  1 Day(s)  # of days IP prior to Palliative referral  0  Clinical Assessment  Psychosocial & Spiritual Assessment  Palliative Care Outcomes      Patient Active Problem List   Diagnosis Date Noted  . Malnutrition of moderate degree 01/27/2018  . ICH (intracerebral hemorrhage) (HCC) 01/26/2018    Palliative Care Assessment & Plan   Patient Profile: 84 y.o. female admitted on 01/26/2018 from [facility] with complaints of s/p unwitnessed fall with possible head injury.  She has a past medical history of CVA with right hemiparesis, Alzheimer's disease, paroxysmal atrial fibrillation on Eliquis, pretension, and chronic venous insufficiency.  Patient was brought in from nursing facility after being found lying on the floor in her bedroom which appears to be an unwitnessed fall and hitting her head.  She is unable to provide history due to dementia, however patient's daughter is at the bedside and provided all information.  During her ED course CT of head showed intracranial hemorrhage.  UA negative.  All lab work unremarkable.  Vital signs  were stable.  Since admission patient was seen by neurosurgery who recommended no  interventions, continue to hold anticoagulants, and to repeat CT scan which was stable with scant intraventricular hemorrhage bilaterally.  No midline shift or lesions require interventions were noted.  Palliative medicine team consulted for goals of care.  Recommendations/Plan:  DNR/DNI  Continue to treat the treatable while hospitalized without aggressive measures.   Family is hopeful patient will continue to show signs of improvement and return to baseline. Expresses goal is for her to discharge to SNF w/PT and outpatient palliative with awareness of need for increased level of care.   PMT will continue to support and follow.   Goals of Care and Additional Recommendations:  Limitations on Scope of Treatment: Full Scope Treatment  Code Status:    Code Status Orders  (From admission, onward)         Start     Ordered   01/27/18 1251  Do not attempt resuscitation (DNR)  Continuous    Question Answer Comment  In the event of cardiac or respiratory ARREST Do not call a "code blue"   In the event of cardiac or respiratory ARREST Do not perform Intubation, CPR, defibrillation or ACLS   In the event of cardiac or respiratory ARREST Use medication by any route, position, wound care, and other measures to relive pain and suffering. May use oxygen, suction and manual treatment of airway obstruction as needed for comfort.      01/27/18 1250        Code Status History    Date Active Date Inactive Code Status Order ID Comments User Context   01/26/2018 1744 01/27/2018 1250 Full Code [id number]  SalaryEvelena Asa, MD Inpatient    Advance Directive Documentation     Most Recent Value  Type of Advance Directive  Healthcare Power of Attorney, Living will  Pre-existing out of facility DNR order (yellow form or pink MOST form)  -  "MOST" Form in Place?  -      Prognosis:   Unable to determine Guarded to poor in the setting of s/p fall with ICH, decreased mobility, CVA with right sided  hemiparesis, atrial fibrillation, Alzheimer's disease behavior disturbances at times.  Discharge Planning:  Skilled Nursing Facility for rehab with Palliative care service follow-up  Care plan was discussed with patient's family & Candace, CSW.   Thank you for allowing the Palliative Medicine Team to assist in the care of this patient.   Total Time 45 min.  Prolonged Time Billed NO       Greater than 50%  of this time was spent counseling and coordinating care related to the above assessment and plan.  Willette Alma, AGPCNP-BC Palliative Medicine Team  Phone: [phone] Fax: [phone] Pager: [phone] Amion: Thea Alken   Please contact Palliative Medicine Team phone at [phone] for questions and concerns.

## 2018-04-27 ENCOUNTER — Non-Acute Institutional Stay: Payer: Medicare Other | Admitting: Student

## 2018-04-27 VITALS — BP 108/80 | HR 136 | Resp 20 | Wt 126.6 lb

## 2018-04-27 DIAGNOSIS — Z515 Encounter for palliative care: Secondary | ICD-10-CM

## 2018-04-27 NOTE — Progress Notes (Signed)
Community Palliative Care Telephone: [phone] Fax: [phone]  PATIENT NAME: Amber Bruce DOB: 02/25/1924 MRN: [id number]  PRIMARY CARE PROVIDER: Dr. Marquis Lunch PROVIDER: Dr. Drue Flirt  [address] [place], [place] [address] Voice: [phone] Pager: [phone] Fax: [phone]   RESPONSIBLE PARTY: daughter Mikeila Sutter    ASSESSMENT: Ms. Cure is a 85 year old patient with multiple medical problems including unspecified injury of head, non-traumatic intracerebral hemorrhage, Alzheimer's disease, moderate protein-calorie malnutrition, cerebral infarction, right sided hemiplegia-dominant side, essential primary hypertension, atrial fibrillation, peripheral venous insufficiency, anxiety, depression, hyperlipidemia. She is observed resting in bed upon arrival. She is cooperative with assessment; answers direct questions. No acute distress. Patient's daughter Talbert Forest is present for facility care plan meeting. We discussed role of Palliative Medicine, goals of care. A blank MOST form given to daughter for review regarding patient's specific wishes.         RECOMMENDATIONS and PLAN:  1. CODE STATUS. DNR is already in place, on chart. MOST form given to daughter for review; will assist with form when daughter ready to fill out form. 2. Medical goals of therapy: Will provide symptom management as needs arise. Will monitor for changes and declines; refer to Hospice when she meets criteria guidelines. 3. Symptom management: Tachycardia- PCP notified of patient's tachycardia by staff nurse Patti; continue metoprolol as ordered. She has referral for Cardiology in place due to atrial fibrillation and not currently on anticoagulant. 4. Discharge Planning: Patient will continue to reside at [facility]. 5. Emotional/spiritual support: Discussed with daughter Talbert Forest, Medical sales representative. They are  encouraged to call as needs or questions arise. Palliative Care to continue to follow for emotional/spiritual support, ongoing discussions trajectory of chronic disease progression, medical goals of therapy, monitor for symptoms with management, and reduce ED and hospitalizations with recommendations. Palliative Medicine to follow up in 6 weeks or sooner, if needed.    I spent 25 minutes providing this consultation,  from 11:40am to 12:05pm. More than 50% of the time in this consultation was spent coordinating communication.   HISTORY OF PRESENT ILLNESS:  Amber Bruce is a 85 y.o. year old female with multiple medical problems including unspecified injury of head, non-traumatic intracerebral hemorrhage, Alzheimer's disease, moderate protein-calorie malnutrition, cerebral infarction, right sided hemiplegia-dominant side, essential primary hypertension, atrial fibrillation, peripheral venous insufficiency, anxiety, depression, hyperlipidemia. Palliative Care was asked to help address ongoing goals of care. Ms. Zyskowski is dependent for all activities of daily living. She requires assistance with meals. She has new order on 04/25/18 for Occupational therapy x 6 weeks. No sleep difficulty reported; patent reports sleeping well at night. Staff nurse Clayborne Dana reports that patient will sometimes refuse to take her medications; she did take her medicine today. She denies any changes or declines. No recent falls reported. No recent emergency room visits or hospitalizations since last Palliative Medicine visit. Daughter Talbert Forest would like for patient's anticoagulant to be restarted; she does have referrals for Cardiologist due to her atrial fibrillation and Neurologist due to her history of CVA, atrial fibrillation and intracerebral hemorrhage. Patient with tachycardia today; she denies chest pain/discomfort, dyspnea, fatigue, anxiety or nausea.  CODE STATUS: DNR  PPS: 30% HOSPICE ELIGIBILITY/DIAGNOSIS: TBD  PAST  MEDICAL HISTORY:  Past Medical History:  Diagnosis Date  . A-fib (HCC)   . Dementia (HCC)   . Stroke [facility])     SOCIAL HX:  Social History   Tobacco  Use  . Smoking status: Former Smoker    Years: 10.00    Types: Cigarettes    Last attempt to quit: 01/26/1966    Years since quitting: 52.2  . Smokeless tobacco: Never Used  Substance Use Topics  . Alcohol use: Never    Frequency: Never    ALLERGIES:  Allergies  Allergen Reactions  . Bee Venom   . Lisinopril   . Penicillins     Has patient had a PCN reaction causing immediate rash, facial/tongue/throat swelling, SOB or lightheadedness with hypotension: Unknown Has patient had a PCN reaction causing severe rash involving mucus membranes or skin necrosis: Unknown Has patient had a PCN reaction that required hospitalization: Unknown Has patient had a PCN reaction occurring within the last 10 years: Unknown If all of the above answers are "NO", then may proceed with Cephalosporin use.     PERTINENT MEDICATIONS:  Outpatient Encounter Medications as of 04/27/2018  Medication Sig  . citalopram (CELEXA) 10 MG tablet Take 15 mg by mouth daily.  Marland Kitchen donepezil (ARICEPT) 5 MG tablet Take 5 mg by mouth at bedtime.  . furosemide (LASIX) 20 MG tablet Take 20 mg by mouth 2 (two) times daily. In the morning and afternoon  . memantine (NAMENDA) 10 MG tablet Take 10 mg by mouth at bedtime.  . metoprolol tartrate (LOPRESSOR) 25 MG tablet Take 25 mg by mouth every 12 (twelve) hours.  . Multiple Vitamins-Minerals (CERTAVITE SENIOR/ANTIOXIDANT) TABS Take 1 tablet by mouth daily.  . potassium chloride (K-DUR) 10 MEQ tablet Take 10 mEq by mouth daily.  Marland Kitchen ammonium lactate (AMLACTIN) 12 % cream Apply topically 2 (two) times daily.  Marland Kitchen atorvastatin (LIPITOR) 40 MG tablet Take 40 mg by mouth at bedtime.  . calamine lotion Apply 1 application topically 2 (two) times a week. On Monday and Friday; apply to back and arm  . clonazePAM (KLONOPIN) 0.5 MG tablet Take  1.5 mg by mouth every Monday, Wednesday, and Friday. At 1400 (2 hours prior to showers)  . feeding supplement, ENSURE ENLIVE, (ENSURE ENLIVE) LIQD Take 237 mLs by mouth 2 (two) times daily between meals.  . triamcinolone cream (KENALOG) 0.1 % Apply 1 application topically 2 (two) times daily. Apply to affected areas on back and legs   No facility-administered encounter medications on file as of 04/27/2018.     PHYSICAL EXAM:   General: NAD, frail appearing, thin Cardiovascular: tachycardia; regular rhythm Pulmonary: clear ant fields Abdomen: soft, nontender, + bowel sounds GU: no suprapubic tenderness Extremities: no edema, no joint deformities Skin: no rashes Neurological: Weakness but otherwise nonfocal  Luella Cook, NP

## 2018-06-07 ENCOUNTER — Other Ambulatory Visit: Payer: Medicare Other | Admitting: Student

## 2018-06-07 ENCOUNTER — Telehealth: Payer: Self-pay | Admitting: Student

## 2018-06-07 DIAGNOSIS — Z515 Encounter for palliative care: Secondary | ICD-10-CM

## 2018-06-07 NOTE — Telephone Encounter (Signed)
NP called to provide an update on today's Palliative visit. Daughter Talbert Forest was unavailable; spoke with son-in law and he will relay message to daughter Talbert Forest.

## 2018-06-07 NOTE — Progress Notes (Signed)
[facility] Consult Note Telephone: [phone]  Fax: [phone]  PATIENT NAME: Amber FriskClara Bruce DOB: [birth year] MRN: [id number]  PRIMARY CARE PROVIDER:   Dr. Youlanda MightyHenry-Smith  REFERRING PROVIDER: Dr. Drue FlirtHenry-Smith, Carol  [address] [place], [place] [address] Voice: [phone] Pager: [phone] Fax: [phone]    RESPONSIBLE PARTY: Daughter, Amber SimmerShirley Malhotra Bruce  ASSESSMENT:  Ms. Amber RootsFreel is resting in bed upon arrival. She answers direct questions. No acute distress noted. Discussed with staff nurse Amber Regalarol. She is made aware of patient heart rate being elevated; she is contacting Dr. Katrinka BlazingSmith.     RECOMMENDATIONS and PLAN:  1. CODE STATUS. DNR is already in place,  2. Medical goals of therapy: Will provide symptom management as needs arise. Will monitor for changes and declines; refer to Hospice when she meets criteria guidelines. 3. Symptom management: Tachycardia- PCP notified of patient's tachycardia by staff nurse Amber Regalarol; continue metoprolol 100mg  BID as scheduled. 4. Discharge Planning: Patient will [facility] to [facility]. 5. Emotional/spiritual support: Discussed with staff nurse Amber Regalarol. Called daughter Amber ForestShirley and left message with her husband. They are encouraged to call as needs or questions arise.  Palliative Care to continue to follow for emotional/spiritual support, ongoing discussions trajectory of chronic disease progression, medical goals of therapy, monitor for symptoms with management, and reduce ED and hospitalizations with recommendations.  Palliative Medicine to follow up in 6 weeks or sooner, if needed.    I spent 15 minutes providing this consultation,  from 11:00am to 11:15am. More than 50% of the time in this consultation was spent coordinating communication.   HISTORY OF PRESENT ILLNESS:  Amber FriskClara Bruce is a 85 y.o.  female with multiple medical problems  including unspecified injury of head, non-traumatic intracerebral hemorrhage, alzheimer's disease, moderate protein-calorie malnutrition, cerebral infarction, right sided hemiplegia-dominant side, essential primary hypertension, atrial fibrillation, peripheral venous insufficiency, anxiety, depression, hyperlipidemia. She is seen for follow up visit today; Palliative Care was asked to help address ongoing goals of care. Ms. Amber RootsFreel denies pain or shortness of breath. She was seen by cardiologist on 05/17/2018 and her metoprolol was increased to 100mg  BID and started on eliquis 2.5mg  BID. Heart rate is 128 today; she denies palpitations, chest pain, fatigue. Right hand is elevated due to edema. Ms. Amber RootsFreel is dependent for all activities of daily living. She requires assistance with meals; fair appetite reported. No sleep difficulty reported; patent reports sleeping well at night. Discussed with staff nurse Amber Regalarol; she denies any changes or declines. No recent falls reported. No recent emergency room visits or hospitalizations since last Palliative Medicine visit.    CODE STATUS: DNR  PPS: 30% HOSPICE ELIGIBILITY/DIAGNOSIS: TBD  PAST MEDICAL HISTORY:  Past Medical History:  Diagnosis Date  . A-fib (HCC)   . Dementia (HCC)   . Stroke [facility](HCC)     SOCIAL HX:  Social History   Tobacco Use  . Smoking status: Former Smoker    Years: 10.00    Types: Cigarettes    Last attempt to quit: 01/26/1966    Years since quitting: 52.3  . Smokeless tobacco: Never Used  Substance Use Topics  . Alcohol use: Never    Frequency: Never    ALLERGIES:  Allergies  Allergen Reactions  . Bee Venom   . Lisinopril   . Penicillins     Has patient had a PCN reaction causing immediate rash, facial/tongue/throat swelling, SOB or lightheadedness with hypotension: Unknown Has patient  had a PCN reaction causing severe rash involving mucus membranes or skin necrosis: Unknown Has patient had a PCN reaction that required  hospitalization: Unknown Has patient had a PCN reaction occurring within the last 10 years: Unknown If all of the above answers are "NO", then may proceed with Cephalosporin use.     PERTINENT MEDICATIONS:  Outpatient Encounter Medications as of 06/07/2018  Medication Sig  . ammonium lactate (AMLACTIN) 12 % cream Apply topically 2 (two) times daily.  Marland Kitchen apixaban (ELIQUIS) 2.5 MG TABS tablet Take 2.5 mg by mouth 2 (two) times daily.  Marland Kitchen atorvastatin (LIPITOR) 40 MG tablet Take 40 mg by mouth at bedtime.  . calamine lotion Apply 1 application topically 2 (two) times a week. On Monday and Friday; apply to back and arm  . citalopram (CELEXA) 10 MG tablet Take 15 mg by mouth daily.  . clonazePAM (KLONOPIN) 0.5 MG tablet Take 1.5 mg by mouth every Monday, Wednesday, and Friday. At 1400 (2 hours prior to showers)  . donepezil (ARICEPT) 5 MG tablet Take 5 mg by mouth at bedtime.  . feeding supplement, ENSURE ENLIVE, (ENSURE ENLIVE) LIQD Take 237 mLs by mouth 2 (two) times daily between meals.  . furosemide (LASIX) 20 MG tablet Take 20 mg by mouth 2 (two) times daily. In the morning and afternoon  . memantine (NAMENDA) 10 MG tablet Take 10 mg by mouth at bedtime.  . metoprolol tartrate (LOPRESSOR) 100 MG tablet Take 100 mg by mouth daily.   . Multiple Vitamins-Minerals (CERTAVITE SENIOR/ANTIOXIDANT) TABS Take 1 tablet by mouth daily.  . potassium chloride (K-DUR) 10 MEQ tablet Take 10 mEq by mouth daily.  Marland Kitchen triamcinolone cream (KENALOG) 0.1 % Apply 1 application topically 2 (two) times daily. Apply to affected areas on back and legs   No facility-administered encounter medications on file as of 06/07/2018.     PHYSICAL EXAM:   General: NAD, frail appearing Cardiovascular: tachycardia, regular rhythm Pulmonary: clear ant fields Abdomen: soft, nontender, + bowel sounds GU: no suprapubic tenderness Extremities: no edema to lower extremities; mild edema to right hand Skin: no rashes Neurological:  Weakness but otherwise nonfocal  Luella Cook, NP

## 2018-07-06 ENCOUNTER — Non-Acute Institutional Stay: Payer: Medicare Other | Admitting: Primary Care

## 2018-07-06 ENCOUNTER — Other Ambulatory Visit: Payer: Self-pay

## 2018-07-06 DIAGNOSIS — E440 Moderate protein-calorie malnutrition: Secondary | ICD-10-CM

## 2018-07-06 DIAGNOSIS — Z515 Encounter for palliative care: Secondary | ICD-10-CM

## 2018-07-06 NOTE — Progress Notes (Signed)
Therapist, nutritional Palliative Care Consult Note Telephone: [phone]  Fax: [phone]  PATIENT NAME: Amber Bruce DOB: 05/12/1923 MRN: [id number]  PRIMARY CARE PROVIDER:   Dione Housekeeper, MD  REFERRING PROVIDER:  Dione Housekeeper, MD [address] [place], [place] [address]  RESPONSIBLE PARTY:   Extended Emergency Contact Information Primary Emergency Contact: Pauletta, Risch Mobile Phone: [phone] Relation: Daughter Secondary Emergency Contact: Charmayne Sheer Mobile Phone: [phone] Relation: Daughter  Palliative medicine was asked to consult on this case. This is an ongoing visit.  ASSESSMENT and RECOMMENDATIONS:   1.Goals of Care: MOST with DNR, limited hospital, limited iv , limited abx, no feeding tube on chart. Weight fluctuation of several pounds. Call to [facility], no answer, message and number left.  2. Nutrition: Recommend dietary supplement. Patient eating about 50% of meals, wt has decreased by several pounds over past months.  Palliative Care to follow for goals of care clarification, symptom management. Return 4 weeks or PRN.  I spent 15 minutes providing this consultation,  from 1245 to 1300. More than 50% of the time in this consultation was spent coordinating communication.   HISTORY OF PRESENT ILLNESS:  Amber Bruce is a 85 y.o. year old female with multiple medical problems including Dementia, Afib, Stroke. Palliative Care was asked to help address goals of care.   CODE STATUS: [place] with DNR, limited hospital, limited iv , limited abx, no feeding tube.  PPS: 30% HOSPICE ELIGIBILITY/DIAGNOSIS: TBD  PAST MEDICAL HISTORY:  Past Medical History:  Diagnosis Date  . A-fib (HCC)   . Dementia (HCC)   . Stroke [facility])     SOCIAL HX:  Social History   Tobacco Use  . Smoking status: Former Smoker    Years: 10.00    Types: Cigarettes    Last attempt to quit: 01/26/1966    Years since quitting: 52.4  .  Smokeless tobacco: Never Used  Substance Use Topics  . Alcohol use: Never    Frequency: Never    ALLERGIES:  Allergies  Allergen Reactions  . Bee Venom   . Lisinopril   . Penicillins     Has patient had a PCN reaction causing immediate rash, facial/tongue/throat swelling, SOB or lightheadedness with hypotension: Unknown Has patient had a PCN reaction causing severe rash involving mucus membranes or skin necrosis: Unknown Has patient had a PCN reaction that required hospitalization: Unknown Has patient had a PCN reaction occurring within the last 10 years: Unknown If all of the above answers are "NO", then may proceed with Cephalosporin use.     PERTINENT MEDICATIONS:  Outpatient Encounter Medications as of 07/06/2018  Medication Sig  . ammonium lactate (AMLACTIN) 12 % cream Apply topically 2 (two) times daily.  Marland Kitchen apixaban (ELIQUIS) 2.5 MG TABS tablet Take 2.5 mg by mouth 2 (two) times daily.  Marland Kitchen atorvastatin (LIPITOR) 40 MG tablet Take 40 mg by mouth at bedtime.  . calamine lotion Apply 1 application topically 2 (two) times a week. On Monday and Friday; apply to back and arm  . citalopram (CELEXA) 10 MG tablet Take 15 mg by mouth daily.  . clonazePAM (KLONOPIN) 0.5 MG tablet Take 1.5 mg by mouth every Monday, Wednesday, and Friday. At 1400 (2 hours prior to showers)  . donepezil (ARICEPT) 5 MG tablet Take 5 mg by mouth at bedtime.  . feeding supplement, ENSURE ENLIVE, (ENSURE ENLIVE) LIQD Take 237 mLs by mouth 2 (two) times daily between meals.  . furosemide (LASIX) 20 MG  tablet Take 20 mg by mouth 2 (two) times daily. In the morning and afternoon  . memantine (NAMENDA) 10 MG tablet Take 10 mg by mouth at bedtime.  . metoprolol tartrate (LOPRESSOR) 100 MG tablet Take 100 mg by mouth daily.   . Multiple Vitamins-Minerals (CERTAVITE SENIOR/ANTIOXIDANT) TABS Take 1 tablet by mouth daily.  . potassium chloride (K-DUR) 10 MEQ tablet Take 10 mEq by mouth daily.  Marland Kitchen triamcinolone cream  (KENALOG) 0.1 % Apply 1 application topically 2 (two) times daily. Apply to affected areas on back and legs   No facility-administered encounter medications on file as of 07/06/2018.     PHYSICAL EXAM:    156/91, HR 79, Resp 18 from SNF record Weight 124.6 lb for March, 126.,8 Feb and 128 Jan. General: NAD, frail appearing, thin Pulmonary: no cough Abdomen: appetite good Extremities: no edema, no joint deformities Skin: no rashes per SNF staff Neurological: Weakness, prefers to stay in bed, dementia  Marijo File DNP, AGPCNP-BC

## 2018-07-21 ENCOUNTER — Telehealth: Payer: Self-pay | Admitting: Primary Care

## 2018-07-21 NOTE — Telephone Encounter (Signed)
T/c from daughter to check on patient.  Call returned, no answer. Will try again.

## 2018-09-07 ENCOUNTER — Other Ambulatory Visit: Payer: Self-pay

## 2018-09-07 ENCOUNTER — Non-Acute Institutional Stay: Payer: Medicare Other | Admitting: Primary Care

## 2018-09-07 DIAGNOSIS — Z515 Encounter for palliative care: Secondary | ICD-10-CM

## 2018-09-07 NOTE — Progress Notes (Signed)
[facility] Consult Note Telephone: [phone]([phone]  Fax: [phone]([phone]  TELEHEALTH VISIT STATEMENT Due to the COVID-19 crisis, this visit was done via telemedicine from my office. It was initiated and consented to by this patient and/or family.  PATIENT NAME: Amber Bruce DOB: [birth year] MRN: [id number]  PRIMARY CARE PROVIDER:   Drue FlirtHenry-Tekila Caillouet, Carol, MD  REFERRING PROVIDER:  Drue FlirtHenry-Samariyah Cowles, Carol, MD [address] [place], [place] [address]  RESPONSIBLE PARTY:   Extended Emergency Contact Information Primary Emergency Contact: Nilda SimmerFreel Carson, Shirley Mobile Phone: [phone] Relation: Daughter Secondary Emergency Contact: Charmayne SheerCraig, Elizabeth Mobile Phone: [phone] Relation: Daughter  Palliative Care was asked to follow patient by consultation request of Dr. Drue FlirtHenry-Nickson Middlesworth, Carol, MD. This is a follow up visit. Present were SNF staff, pt and myself.       ASSESSMENT and RECOMMENDATIONS:   1. Goals of Care: DNR currently, MOST with DNR, limited hospital, limited iv, limited abx, no feeding tube. Daughter states these are still her wishes but she wants to review her mother's living will.  She states staff reports facial drooping about 10  days ago. Family states she has become less verbal, and feels pt  had a CVA.  Daughter and myself did not see any facial drooping on recent video, this may have resolved. Her speech is affected, not speaking much, and  perhaps swallowing problems.   Daughter states she sleeps more and more, and has withdrawn. Wants to continue to assess potential and did not want a hospice referral at this time although I did advise her that patient is medically appropriate.   2. Nutrition; Please send nutrition consult ASAP  for poor intake,  recommendations for supplements and food consistency and liquid thickness. 16 pound weight loss in 4 months. Please see if there are  foods patient would like. Staff reports patient is not  eating "at all "over past few days, pulling away and shaking her head no. Unclear if she gets supplements.   3. Dysphagia: Consider ST assessment of swallowing given recent decline in intake, and suspected vascular event (per daughter reports) 10 days ago). We discussed artificial hydration / feeding in light of major life limiting disease. Daughter was conflicted about plan of care and artificial hydration. I provided information RE disease process and benefit from various medical inventions.  4. Hospice eligibility: Referral would be appropriate but family was reticent today. Discussed at length disease process, hospice program, goals of care, efficacy of a range of medical interventions. Visit consisted of counseling and education dealing with the complex and emotionally intense issues of symptom management and palliative care in the setting of serious and potentially life-threatening illness.   Palliative care will continue to follow for goals of care clarification and symptom management. Return 1-2  weeks or prn. Hospice appropriate at this time, advanced dementia diagnosis.  I spent 85 minutes providing this consultation,  from 1425 to 1550. More than 50% of the time in this consultation was spent coordinating communication.   HISTORY OF PRESENT ILLNESS:  Amber FriskClara Bruce is a 85 y.o. year old female with multiple medical problems including weight loss, advanced dementia, h/o CVA, possible recent CVA (clinical dx only). Palliative Care was asked to help address goals of care.   CODE STATUS: DNR, MOST with limited treatment, limited antibiotic and  IV, no feeding tube per staff report, on file at SNF.  PPS: 20% HOSPICE ELIGIBILITY/DIAGNOSIS: yes, due to poor intake and weight loss, advanced dementia. Patient's prognosis is likely <  6 months,  PAST MEDICAL HISTORY:  Past Medical History:  Diagnosis Date  . A-fib (HCC)   . Dementia (HCC)   . Stroke [facility])     SOCIAL HX:  Social History    Tobacco Use  . Smoking status: Former Smoker    Years: 10.00    Types: Cigarettes    Last attempt to quit: 01/26/1966    Years since quitting: 52.6  . Smokeless tobacco: Never Used  Substance Use Topics  . Alcohol use: Never    Frequency: Never    ALLERGIES:  Allergies  Allergen Reactions  . Bee Venom   . Lisinopril   . Penicillins     Has patient had a PCN reaction causing immediate rash, facial/tongue/throat swelling, SOB or lightheadedness with hypotension: Unknown Has patient had a PCN reaction causing severe rash involving mucus membranes or skin necrosis: Unknown Has patient had a PCN reaction that required hospitalization: Unknown Has patient had a PCN reaction occurring within the last 10 years: Unknown If all of the above answers are "NO", then may proceed with Cephalosporin use.     PERTINENT MEDICATIONS:  Outpatient Encounter Medications as of 09/07/2018  Medication Sig  . ammonium lactate (AMLACTIN) 12 % cream Apply topically 2 (two) times daily.  Marland Kitchen apixaban (ELIQUIS) 2.5 MG TABS tablet Take 2.5 mg by mouth 2 (two) times daily.  Marland Kitchen atorvastatin (LIPITOR) 40 MG tablet Take 40 mg by mouth at bedtime.  . calamine lotion Apply 1 application topically 2 (two) times a week. On Monday and Friday; apply to back and arm  . citalopram (CELEXA) 10 MG tablet Take 15 mg by mouth daily.  . clonazePAM (KLONOPIN) 0.5 MG tablet Take 1.5 mg by mouth every Monday, Wednesday, and Friday. At 1400 (2 hours prior to showers)  . donepezil (ARICEPT) 5 MG tablet Take 5 mg by mouth at bedtime.  . feeding supplement, ENSURE ENLIVE, (ENSURE ENLIVE) LIQD Take 237 mLs by mouth 2 (two) times daily between meals.  . furosemide (LASIX) 20 MG tablet Take 20 mg by mouth 2 (two) times daily. In the morning and afternoon  . memantine (NAMENDA) 10 MG tablet Take 10 mg by mouth at bedtime.  . metoprolol tartrate (LOPRESSOR) 100 MG tablet Take 100 mg by mouth daily.   . Multiple Vitamins-Minerals (CERTAVITE  SENIOR/ANTIOXIDANT) TABS Take 1 tablet by mouth daily.  . potassium chloride (K-DUR) 10 MEQ tablet Take 10 mEq by mouth daily.  Marland Kitchen triamcinolone cream (KENALOG) 0.1 % Apply 1 application topically 2 (two) times daily. Apply to affected areas on back and legs   No facility-administered encounter medications on file as of 09/07/2018.     PHYSICAL EXAM:   General: NAD, frail appearing, thin, not eating per staff Cardiovascular: no report of chest pain Pulmonary: no sob, no cough,  Abdomen: appetite very poor, will not eat over past few days. Wt loss from January 2020 is 16 lbs., BM regular with laxative prn GU: no suprapubic tenderness, incontinent Extremities: no edema, bedbound Skin: no rashes, resolving calf wound, heel protectors in place. Neurological: Weakness , advanced dementia,  Family suspected cva 1-2 weeks ago.   Marijo File DNP, AGPCNP-BC

## 2018-09-14 ENCOUNTER — Non-Acute Institutional Stay: Payer: Medicare Other | Admitting: Primary Care

## 2018-09-14 ENCOUNTER — Other Ambulatory Visit: Payer: Self-pay

## 2018-09-14 DIAGNOSIS — Z515 Encounter for palliative care: Secondary | ICD-10-CM

## 2018-09-14 NOTE — Progress Notes (Signed)
[facility], [facility] Consult Note Telephone: [phone]  Fax: [phone]  TELEHEALTH VISIT STATEMENT Due to the COVID-19 crisis, this visit was done via telemedicine from my office. It was initiated and consented to by this patient and/or family.   PATIENT NAME: Amber FriskClara Bruce DOB: Sep 19, 1923 MRN: [id number]  PRIMARY CARE PROVIDER:   Drue FlirtHenry-Carlie Corpus, Carol, MD  REFERRING PROVIDER:  Drue FlirtHenry-Ladamien Rammel, Carol, MD [address] [place], [place] [address]  RESPONSIBLE PARTY:   Extended Emergency Contact Information Primary Emergency Contact: Nilda SimmerFreel Carson, Shirley Mobile Phone: [phone] Relation: Daughter Secondary Emergency Contact: Charmayne SheerCraig, Elizabeth Mobile Phone: [phone] Relation: Daughter  Palliative Care was asked to follow patient by consultation request of Dr. Drue FlirtHenry-Shrika Milos, Carol, MD  . This is a follow up visit.        ASSESSMENT and RECOMMENDATIONS:   1. Goals of care: DNR currently, MOST with DNR, limited hospital, limited iv, limited abx, no feeding tube.  Recommendation to refer to hospice. Discussed appropriateness of hospice with daughter Geanie CooleyShirley Carson  again today. She states she does see the decline, that they've had some video chats. States she would like to speak to her mother's caregiver Orlie PollenLynne, Charity fundraiserN, who she feels can tell her when it's time to consult hospice. I will email Beth, SW to f/u with this to facilitate referral if requested. It is my recommendation to refer to hospice at this time.  Lengthy discussion with daughter about hospice services, disease process, end of life care and bereavement and support. Visit consisted of counseling and education dealing with the complex and emotionally intense issues of symptom management and palliative care in the setting of serious and potentially life-threatening illness.  2. Constipation: Recommend miralax 17 gm daily in liquid and Senna 1-3 tabs up to bid titrate to a soft bm every other day.   Needs increase in bowel program due to poor intake and decreased mobility. BMs should still be regular even with poor po intake and may in fact affect appetite and intake.   3. Nutrition: Optimizing offerings are appropriate but poor intake is likely due to advancing disease process. Poor intake, pt is refusing food, taking some liquids. Dietary consulted and recommended soft foods. Pt is edentulous and dentures are not fitting well.   Palliative care will continue to follow for goals of care clarification and symptom management. Return 1 week or prn.  I spent 75 minutes providing this consultation,  from 1500 to 1615. More than 50% of the time in this consultation was spent coordinating communication.   HISTORY OF PRESENT ILLNESS:  Amber FriskClara Ohlson is a 85 y.o. year old female with multiple medical problems including weight loss, advanced dementia, h/o CVA, possible recent CVA (clinical dx only). Palliative Care was asked to help address goals of care.   CODE STATUS: DNR currently, MOST with DNR, limited hospital, limited iv, limited abx, no feeding tube.  PPS: 20% HOSPICE ELIGIBILITY/DIAGNOSIS: yes, due to poor intake and weight loss, advanced dementia. Patient's prognosis is likely < 6 months  PAST MEDICAL HISTORY:  Past Medical History:  Diagnosis Date  . A-fib (HCC)   . Dementia (HCC)   . Stroke [facility](HCC)     SOCIAL HX:  Social History   Tobacco Use  . Smoking status: Former Smoker    Years: 10.00    Types: Cigarettes    Last attempt to quit: 01/26/1966    Years since quitting: 52.6  . Smokeless tobacco: Never Used  Substance Use Topics  . Alcohol use:  Never    Frequency: Never    ALLERGIES:  Allergies  Allergen Reactions  . Bee Venom   . Lisinopril   . Penicillins     Has patient had a PCN reaction causing immediate rash, facial/tongue/throat swelling, SOB or lightheadedness with hypotension: Unknown Has patient had a PCN reaction causing severe rash involving mucus membranes  or skin necrosis: Unknown Has patient had a PCN reaction that required hospitalization: Unknown Has patient had a PCN reaction occurring within the last 10 years: Unknown If all of the above answers are "NO", then may proceed with Cephalosporin use.     PERTINENT MEDICATIONS:  Outpatient Encounter Medications as of 09/14/2018  Medication Sig  . ammonium lactate (AMLACTIN) 12 % cream Apply topically 2 (two) times daily.  Marland Kitchen apixaban (ELIQUIS) 2.5 MG TABS tablet Take 2.5 mg by mouth 2 (two) times daily.  Marland Kitchen atorvastatin (LIPITOR) 40 MG tablet Take 40 mg by mouth at bedtime.  . calamine lotion Apply 1 application topically 2 (two) times a week. On Monday and Friday; apply to back and arm  . citalopram (CELEXA) 10 MG tablet Take 15 mg by mouth daily.  . clonazePAM (KLONOPIN) 0.5 MG tablet Take 1.5 mg by mouth every Monday, Wednesday, and Friday. At 1400 (2 hours prior to showers)  . donepezil (ARICEPT) 5 MG tablet Take 5 mg by mouth at bedtime.  . feeding supplement, ENSURE ENLIVE, (ENSURE ENLIVE) LIQD Take 237 mLs by mouth 2 (two) times daily between meals.  . furosemide (LASIX) 20 MG tablet Take 20 mg by mouth 2 (two) times daily. In the morning and afternoon  . memantine (NAMENDA) 10 MG tablet Take 10 mg by mouth at bedtime.  . metoprolol tartrate (LOPRESSOR) 100 MG tablet Take 100 mg by mouth daily.   . Multiple Vitamins-Minerals (CERTAVITE SENIOR/ANTIOXIDANT) TABS Take 1 tablet by mouth daily.  . potassium chloride (K-DUR) 10 MEQ tablet Take 10 mEq by mouth daily.  Marland Kitchen triamcinolone cream (KENALOG) 0.1 % Apply 1 application topically 2 (two) times daily. Apply to affected areas on back and legs   No facility-administered encounter medications on file as of 09/14/2018.     PHYSICAL EXAM:    Weight monthly, 112 on 5/3, 14 lb  weight loss in 4 mos (11 % loss) General: NAD, frail appearing, thin, declining continuing, only answers yes or no questions sporadically.  Cardiovascular: no chest pain  reported Pulmonary: no cough , no cyanosis. Abdomen: will not eat, changed diet to soft, drinking some liquid,  no bm  X 5 days, MOM tonight per staff report. GU: incontinent Extremities: no edema, non ambulatory  Skin: no rashes, no new lesions, on air mattress Neurological: Weakness, awake but non interactive, min. verbal  Marijo File DNP, AGPCNP-BC

## 2018-09-20 ENCOUNTER — Telehealth: Payer: Self-pay | Admitting: Primary Care

## 2018-09-20 NOTE — Telephone Encounter (Signed)
Secure email communication from  [facility] nursing home. Family does not wish to have a hospice referral made at this time. They will communicate with the SNF at a future time, if desired. Will continue to follow for palliative symptom management.

## 2018-09-28 ENCOUNTER — Non-Acute Institutional Stay: Payer: Medicare Other | Admitting: Primary Care

## 2018-09-28 ENCOUNTER — Other Ambulatory Visit: Payer: Self-pay

## 2018-09-28 DIAGNOSIS — Z515 Encounter for palliative care: Secondary | ICD-10-CM

## 2018-09-28 NOTE — Progress Notes (Signed)
Therap[facility], [facility] Consult Note Telephone: [phone](336) [phone]  Fax: [phone](336) [phone]  TELEHEALTH VISIT STATEMENT Due to the COVID-19 crisis, this visit was done via telemedicine from my office. It was initiated and consented to by this patient and/or family.  PATIENT NAME: Amber FriskClara Bruce DOB: 1923/11/29 MRN: [id number]  PRIMARY CARE PROVIDER:   Drue FlirtHenry-Gamaliel Charney, Carol, MD  REFERRING PROVIDER:  Drue FlirtHenry-Payne Garske, Carol, MD [address] [place], [place] [address]  RESPONSIBLE PARTY:   Extended Emergency Contact Information Primary Emergency Contact: Nilda SimmerFreel Carson, Shirley Mobile Phone: [phone] Relation: Daughter Secondary Emergency Contact: Charmayne SheerCraig, Elizabeth Mobile Phone: [phone] Relation: Daughter  Palliative Care was asked to follow patient by consultation request of Drue FlirtHenry-Zayanna Pundt, Carol, MD. This is the follow up visit.  ASSESSMENT AND RECOMMENDATIONS:   1. Goals of Care: Maximize quality of life, provide symptom management.  2. Symptom Management:   Constipation continues, recommend senna 3 tabs crushed twice a day. Miralax also would be beneficial but patient may not take enough liquids. Goal is 3-4 soft formed stools/ week.  Nutrition: Please start weekly weights would inform appropriate hospice referral. Intake is  increasing slightly from past weeks, now taking <25% food, some supplements.  3. Family Supports: Family decided not to elect hospice benefit after our discussion 2 weeks ago. Palliative will continue.  4. Cognitive / Functional decline: No changes, non verbal, does not interact with staff.  5. Advanced Care Directive: DNR currently, MOSTwith DNR, limited hospital, limited iv,limited abx, no feeding tube.  6. Follow up Palliative Care Visit: Palliative care will continue to follow for goals of care clarification and symptom management. Return 4 weeks or prn.  I spent 15 minutes providing this consultation,  from 1330 to 1345. More  than 50% of the time in this consultation was spent coordinating communication.   HISTORY OF PRESENT ILLNESS:  Amber FriskClara Bruce is a 85 y.o. year old female with multiple medical problems including weight loss, advanceddementia, h/oCVA, possible recent CVA (clinical dx only). Palliative Care was asked to help address goals of care.   CODE STATUS: DNR currently, MOSTwith DNR, limited hospital, limited iv,limited abx, no feeding tube.  PPS: 20% HOSPICE ELIGIBILITY/DIAGNOSIS: TBD  PAST MEDICAL HISTORY:  Past Medical History:  Diagnosis Date  . A-fib (HCC)   . Dementia (HCC)   . Stroke [facility](HCC)     SOCIAL HX:  Social History   Tobacco Use  . Smoking status: Former Smoker    Years: 10.00    Types: Cigarettes    Last attempt to quit: 01/26/1966    Years since quitting: 52.7  . Smokeless tobacco: Never Used  Substance Use Topics  . Alcohol use: Never    Frequency: Never    ALLERGIES:  Allergies  Allergen Reactions  . Bee Venom   . Lisinopril   . Penicillins     Has patient had a PCN reaction causing immediate rash, facial/tongue/throat swelling, SOB or lightheadedness with hypotension: Unknown Has patient had a PCN reaction causing severe rash involving mucus membranes or skin necrosis: Unknown Has patient had a PCN reaction that required hospitalization: Unknown Has patient had a PCN reaction occurring within the last 10 years: Unknown If all of the above answers are "NO", then may proceed with Cephalosporin use.     PERTINENT MEDICATIONS:  Outpatient Encounter Medications as of 09/28/2018  Medication Sig  . ammonium lactate (AMLACTIN) 12 % cream Apply topically 2 (two) times daily.  Marland Kitchen. apixaban (ELIQUIS) 2.5 MG TABS tablet Take 2.5 mg by  mouth 2 (two) times daily.  Marland Kitchen atorvastatin (LIPITOR) 40 MG tablet Take 40 mg by mouth at bedtime.  . calamine lotion Apply 1 application topically 2 (two) times a week. On Monday and Friday; apply to back and arm  . citalopram (CELEXA) 10 MG  tablet Take 15 mg by mouth daily.  . clonazePAM (KLONOPIN) 0.5 MG tablet Take 1.5 mg by mouth every Monday, Wednesday, and Friday. At 1400 (2 hours prior to showers)  . donepezil (ARICEPT) 5 MG tablet Take 5 mg by mouth at bedtime.  . feeding supplement, ENSURE ENLIVE, (ENSURE ENLIVE) LIQD Take 237 mLs by mouth 2 (two) times daily between meals.  . furosemide (LASIX) 20 MG tablet Take 20 mg by mouth 2 (two) times daily. In the morning and afternoon  . memantine (NAMENDA) 10 MG tablet Take 10 mg by mouth at bedtime.  . metoprolol tartrate (LOPRESSOR) 100 MG tablet Take 100 mg by mouth daily.   . Multiple Vitamins-Minerals (CERTAVITE SENIOR/ANTIOXIDANT) TABS Take 1 tablet by mouth daily.  . potassium chloride (K-DUR) 10 MEQ tablet Take 10 mEq by mouth daily.  Marland Kitchen triamcinolone cream (KENALOG) 0.1 % Apply 1 application topically 2 (two) times daily. Apply to affected areas on back and legs   No facility-administered encounter medications on file as of 09/28/2018.     PHYSICAL EXAM/ROS:   97. 3  126/66  HR 66 RR 16 Current and past weights: Asked for weekly weights to track decline. General: NAD, frail, thin Cardiovascular: no chest pain reported, no edema,  Pulmonary: no cough, no increased SOB Abdomen: appetite poor, 1-25%,  endorses increased constipation, no bm x 4 days,  GU: denies dysuria MSK:  Bed to chair with lift. Skin: no rashes, wounds on legs. Shear on R elbow as well. On air mattress Neurological: Weakness, advanced dementia, FAST 7C, requires full assistance with all adls, does not interact or regard  Marijo File DNP, AGPCNP-BC

## 2018-10-12 ENCOUNTER — Other Ambulatory Visit: Payer: Self-pay

## 2018-10-12 ENCOUNTER — Non-Acute Institutional Stay: Payer: Medicare Other | Admitting: Primary Care

## 2018-10-12 VITALS — BP 129/68 | HR 64 | Temp 97.5°F | Wt 112.0 lb

## 2018-10-12 DIAGNOSIS — Z515 Encounter for palliative care: Secondary | ICD-10-CM

## 2018-10-12 NOTE — Progress Notes (Signed)
[facility] Palliative Care Consult Note Telephone: [phone]  Fax: [phone]  TELEHEALTH VISIT STATEMENT Due to the COVID-19 crisis, this visit was done via telemedicine from my office. It was initiated and consented to by this patient and/or family.  PATIENT NAME: Amber Bruce DOB: Jan 09, 1924 MRN: [id number]  PRIMARY CARE PROVIDER:   Marisa Bruce, [place]  REFERRING PROVIDER:  Marisa Hua, Bruce [address] [place],  [place] [address] [phone]  RESPONSIBLE PARTY:   Extended Emergency Contact Information Primary Emergency Contact: Amber Bruce Mobile Phone: [phone] Relation: Daughter Secondary Emergency Contact: Amber Bruce Mobile Phone: [phone] Relation: Daughter  Palliative Care was asked to follow this patient by consultation request of Amber Bruce. This is a follow up visit. Staff, patient and myself conducted the telemedicine meeting.  ASSESSMENT AND RECOMMENDATIONS:   1. Goals of Care: Maximize quality of life and symptom management.  2. Symptom Management:   Constipation Recommend 17 gm miralax daily in 8 oz, and senna 2 bid daily, NOT prn, for ongoing constipation. BMS  Are infrequent and patient has anorexia.  Nutrition: Continue with anything she'd enjoy, and supplements, milk shakes. This visit she has  2 lb wt gain. Please continue weekly weights.   Pain: 0/10 on PAINAD scale, use facility protocol for incidental pain.   3. Family /Caregiver/Community Supports: Family cannot visit due to covid. Care supplied by SNF. Remains hospice appropriate should family elect services.  4. Cognitive / Functional decline:  Non -verbal, does not interact with staff although today is regarding the video and looks more aware and alert than previous visits.   5. Advanced Care Directive: DNR currently, MOSTwith DNR, limited hospital, limited iv,limited abx, no feeding tube.DNR  uploaded in [place] today.  6. Follow up Palliative Care Visit: Palliative care will continue to follow for goals of care clarification and symptom management. Return 4weeks or prn.  I spent 25 minutes providing this consultation,  from 1415 to 1440. More than 50% of the time in this consultation was spent coordinating communication.   HISTORY OF PRESENT ILLNESS:  Amber Bruce is a 85 y.o. year old female with multiple medical problems including weight loss, advanceddementia, h/oCVA, possible recent CVA (clinical dx only). Palliative Care was asked to help address goals of care.   CODE STATUS: DNR currently, MOSTwith DNR, limited hospital, limited iv,limited abx, no feeding tube.  PPS: 20% HOSPICE ELIGIBILITY/DIAGNOSIS: TBD  PAST MEDICAL HISTORY:  Past Medical History:  Diagnosis Date  . A-fib ([place])   . Dementia ([place])   . Stroke [facility])     SOCIAL HX:  Social History   Tobacco Use  . Smoking status: Former Smoker    Years: 10.00    Types: Cigarettes    Quit date: 01/26/1966    Years since quitting: 52.7  . Smokeless tobacco: Never Used  Substance Use Topics  . Alcohol use: Never    Frequency: Never    ALLERGIES:  Allergies  Allergen Reactions  . Bee Venom   . Lisinopril   . Penicillins     Has patient had a PCN reaction causing immediate rash, facial/tongue/throat swelling, SOB or lightheadedness with hypotension: Unknown Has patient had a PCN reaction causing severe rash involving mucus membranes or skin necrosis: Unknown Has patient had a PCN reaction that required hospitalization: Unknown Has patient had a PCN reaction occurring within the last 10 years: Unknown If all of the above answers are "NO", then may proceed with Cephalosporin use.  PERTINENT MEDICATIONS:  Outpatient Encounter Medications as of 10/12/2018  Medication Sig  . ammonium lactate (AMLACTIN) 12 % cream Apply topically 2 (two) times daily.  Marland Kitchen. apixaban (ELIQUIS) 2.5 MG TABS tablet Take 2.5 mg by  mouth 2 (two) times daily.  Marland Kitchen. atorvastatin (LIPITOR) 40 MG tablet Take 40 mg by mouth at bedtime.  . calamine lotion Apply 1 application topically 2 (two) times a week. On Monday and Friday; apply to back and arm  . citalopram (CELEXA) 10 MG tablet Take 15 mg by mouth daily.  . clonazePAM (KLONOPIN) 0.5 MG tablet Take 1.5 mg by mouth every Monday, Wednesday, and Friday. At 1400 (2 hours prior to showers)  . donepezil (ARICEPT) 5 MG tablet Take 5 mg by mouth at bedtime.  . feeding supplement, ENSURE ENLIVE, (ENSURE ENLIVE) LIQD Take 237 mLs by mouth 2 (two) times daily between meals.  . furosemide (LASIX) 20 MG tablet Take 20 mg by mouth 2 (two) times daily. In the morning and afternoon  . memantine (NAMENDA) 10 MG tablet Take 10 mg by mouth at bedtime.  . metoprolol tartrate (LOPRESSOR) 100 MG tablet Take 100 mg by mouth daily.   . Multiple Vitamins-Minerals (CERTAVITE SENIOR/ANTIOXIDANT) TABS Take 1 tablet by mouth daily.  . potassium chloride (K-DUR) 10 MEQ tablet Take 10 mEq by mouth daily.  Marland Kitchen. triamcinolone cream (KENALOG) 0.1 % Apply 1 application topically 2 (two) times daily. Apply to affected areas on back and legs   No facility-administered encounter medications on Bruce as of 10/12/2018.     PHYSICAL EXAM/ROS:    97.5-64-17 BP 129/68 Oxygen saturation 92%  Current and past weights: Current Wt 114.6,  Up from 112 lb a month ago 124.6 lb for March, 2020  126.,8  Lb Feb  2020, and 128 lb Jan 2020 General: NAD, frail appearing, thin Cardiovascular: no chest pain reported, no edema,  Pulmonary: no cough, no increased SOB, RA Abdomen: appetite poor, 1-25% intake, endorses constipation not controlled by prn protocol, incontinent of bowel, taking ice cream and milk, getting milk shake supplements. GU: denies dysuria, incontinent of urine MSK:  Non ambulatory, uses lift to transfer. Skin: no rashes, R leg wound reported ongoing, air mattress for wound prevention Neurological: Weakness,  advanced dementia, FAST score 7 c-d.   Amber FileKathryn M Joceline Hinchcliff DNP, AGPCNP-BC

## 2018-10-26 ENCOUNTER — Non-Acute Institutional Stay: Payer: Medicare Other | Admitting: Primary Care

## 2018-10-26 DIAGNOSIS — Z515 Encounter for palliative care: Secondary | ICD-10-CM

## 2018-10-26 NOTE — Progress Notes (Signed)
[facility] Consult Note Telephone: [phone]  Fax: [phone]  TELEHEALTH VISIT STATEMENT Due to the COVID-19 crisis, this visit was done via telemedicine from my office. It was initiated and consented to by this patient and/or family.  PATIENT NAME: Amber Bruce Bro[place]us DOB: 1924/03/13 MRN: [id number]  PRIMARY CARE PROVIDER:   Drue FlirtHenry-Zula Hovsepian, Carol, MD [phone]  REFERRING PROVIDER:  Drue FlirtHenry-Tyrelle Raczka, Carol, MD [address] [place],  [place] [id number] [phone]  RESPONSIBLE PARTY:   Extended Emergency Contact Information Primary Emergency Contact: Nilda SimmerFreel Carson, [place] Mobile Phone: [phone] Relation: Daughter Secondary Emergency Contact: Charmayne SheerCraig, Elizabeth Mobile Phone: [phone] Relation: Daughter  Palliative Care was asked to follow this patient by consultation request of Drue FlirtHenry-Renada Cronin, Carol, MD. This is a follow up visit.  ASSESSMENT AND RECOMMENDATIONS:   1. Goals of Care: Maximize quality of life and symptom management.  2. Symptom Management:   Active Dying: Call from SNF for visit to assess end of life symptoms. Family has been called, and are on their way. Patient has tachycardia (150's) and oxygen sats in 60's on room air. SNF staff started oxygen and sats are in high 90's. Education provided re oxygen and use in EOL, that it may not change dyspnea. Also have roxanol and ativan orders from PCP and are calling for delivery now.  3. Family /Caregiver/Community Supports: [facility] and family are en route to see her at end of life.  4. Cognitive / Functional decline: Patient has been in steep decline over past 4-6 weeks. Hospice services were offered on  multiple occasions but family elected not to sign up.  5. Advanced Care Directive: DNR in chart and in [facility].  6. Follow up Palliative Care Visit: Palliative care will continue to follow for goals of care clarification and symptom management. Will refer to  hospice if death is not imminent  I spent 10 minutes providing this consultation,  from 1250 to 1300. More than 50% of the time in this consultation was spent coordinating communication.   HISTORY OF PRESENT ILLNESS:  Amber Bruce S[place]bury is a 85 y.o. year old female with multiple medical problems including weight loss, advanceddementia, h/oCVA, possible recent CVA (clinical dx only). Palliative Care was asked to help address goals of care.   CODE STATUS: DNR  PPS: 10% HOSPICE ELIGIBILITY/DIAGNOSIS: TBD  PAST MEDICAL HISTORY:  Past Medical History:  Diagnosis Date  . A-fib ([facility])   . Dementia ([facility])   . Stroke [facility])     SOCIAL HX:  Social History   Tobacco Use  . Smoking status: Former Smoker    Years: 10.00    Types: Cigarettes    Quit date: 01/26/1966    Years since quitting: 52.7  . Smokeless tobacco: Never Used  Substance Use Topics  . Alcohol use: Never    Frequency: Never    ALLERGIES:  Allergies  Allergen Reactions  . Bee Venom   . Lisinopril   . Penicillins     Has patient had a PCN reaction causing immediate rash, facial/tongue/throat swelling, SOB or lightheadedness with hypotension: Unknown Has patient had a PCN reaction causing severe rash involving mucus membranes or skin necrosis: Unknown Has patient had a PCN reaction that required hospitalization: Unknown Has patient had a PCN reaction occurring within the last 10 years: Unknown If all of the above answers are "NO", then may proceed with Cephalosporin use.     PERTINENT MEDICATIONS:  Outpatient Encounter Medications as of 10/26/2018  Medication Sig  .  ammonium lactate (AMLACTIN) 12 % cream Apply topically 2 (two) times daily.  Marland Kitchen apixaban (ELIQUIS) 2.5 MG TABS tablet Take 2.5 mg by mouth 2 (two) times daily.  Marland Kitchen atorvastatin (LIPITOR) 40 MG tablet Take 40 mg by mouth at bedtime.  . calamine lotion Apply 1 application topically 2 (two) times a week. On Monday and Friday; apply to back and arm  . citalopram  (CELEXA) 10 MG tablet Take 15 mg by mouth daily.  . clonazePAM (KLONOPIN) 0.5 MG tablet Take 1.5 mg by mouth every Monday, Wednesday, and Friday. At 1400 (2 hours prior to showers)  . donepezil (ARICEPT) 5 MG tablet Take 5 mg by mouth at bedtime.  . feeding supplement, ENSURE ENLIVE, (ENSURE ENLIVE) LIQD Take 237 mLs by mouth 2 (two) times daily between meals.  . furosemide (LASIX) 20 MG tablet Take 20 mg by mouth 2 (two) times daily. In the morning and afternoon  . memantine (NAMENDA) 10 MG tablet Take 10 mg by mouth at bedtime.  . metoprolol tartrate (LOPRESSOR) 100 MG tablet Take 100 mg by mouth daily.   . Multiple Vitamins-Minerals (CERTAVITE SENIOR/ANTIOXIDANT) TABS Take 1 tablet by mouth daily.  . potassium chloride (K-DUR) 10 MEQ tablet Take 10 mEq by mouth daily.  Marland Kitchen triamcinolone cream (KENALOG) 0.1 % Apply 1 application topically 2 (two) times daily. Apply to affected areas on back and legs   No facility-administered encounter medications on file as of 10/26/2018.     PHYSICAL EXAM/ROS:per staff report  Current and past weights: N/a General: tachycardia, frail, thin Cardiovascular: HR 150's, peripheral mottling per report Pulmonary: no cough, SOB, oxygen at 3 L, 97% with oxygen, 60's with RA MSK: peripheral mottling Skin: wounds per previous assessment Neurological: non responsive  Cyndia Skeeters DNP, AGPCNP-BC

## 2018-10-30 ENCOUNTER — Telehealth: Payer: Self-pay | Admitting: Primary Care

## 2018-10-30 NOTE — Telephone Encounter (Signed)
Call from SNF, patient passed away Nov 19, 2018.

## 2018-11-25 DEATH — deceased

## 2020-01-04 IMAGING — CT CT HEAD W/O CM
3 of 4 series · 15 of 47 positions shown, 18 images · non-contrast
Comparison: January 26, 2018 study performed earlier in the day.

CLINICAL DATA: Recent fall with known intraventricular hemorrhage

EXAM:
CT HEAD WITHOUT CONTRAST
TECHNIQUE: Contiguous axial images were obtained from the base of the skull
through the vertex without intravenous contrast.

[Series 3: head wo · axial · 0.42mm/px · z∈[+354,+484]mm · 9 of 32 slices shown, 12 images]
[im 3/32  brain]
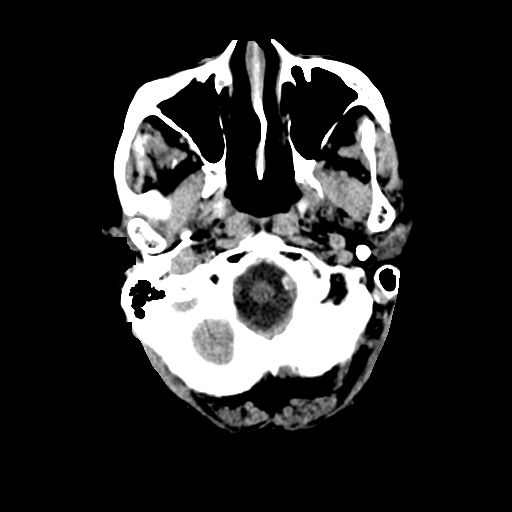
[im 3/32  bone]
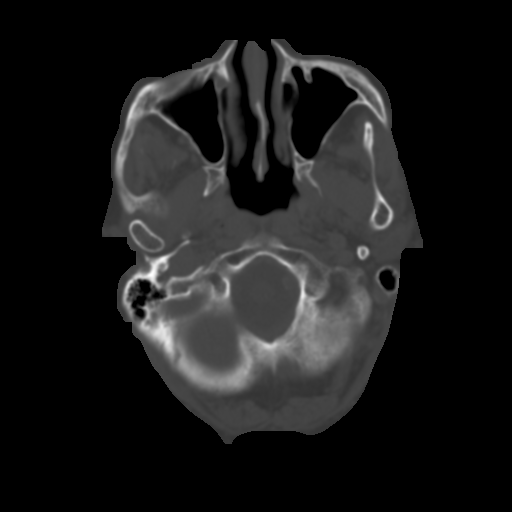
[im 7/32  brain]
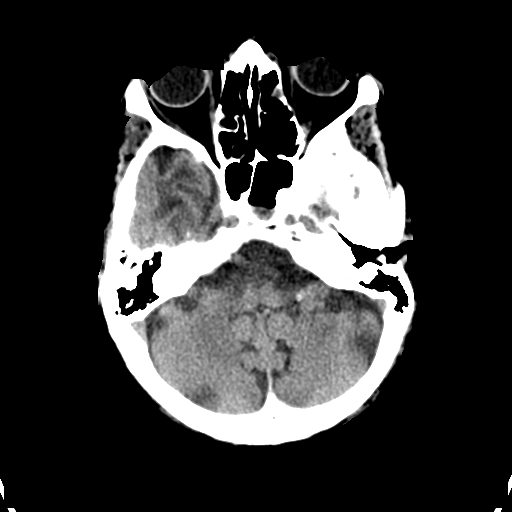
[im 9/32  brain]
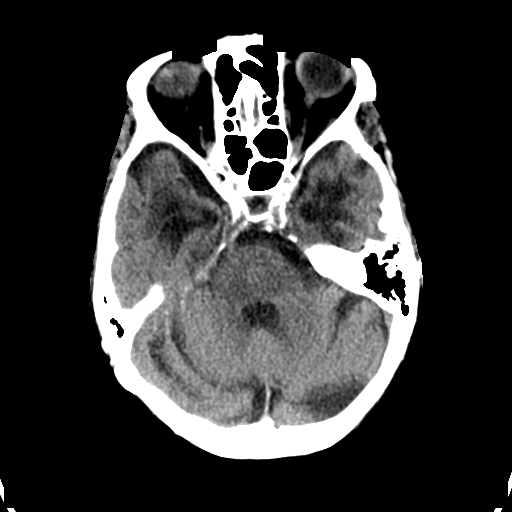
[im 13/32  brain]
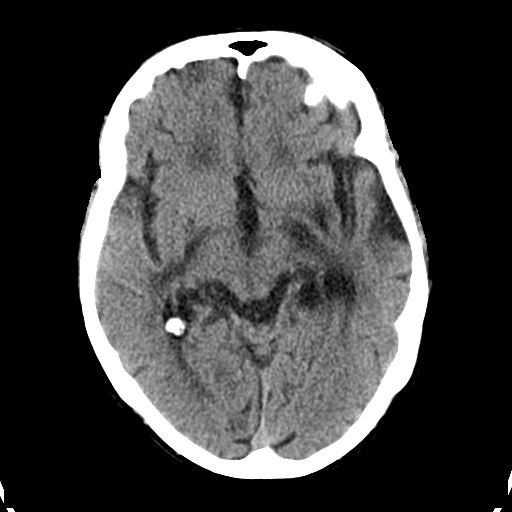
[im 17/32  brain]
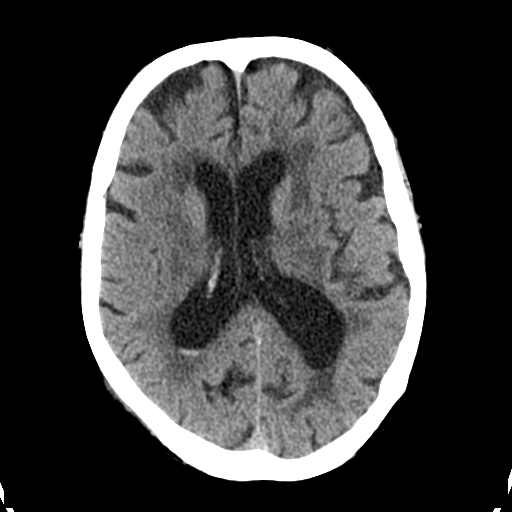
[im 17/32  bone]
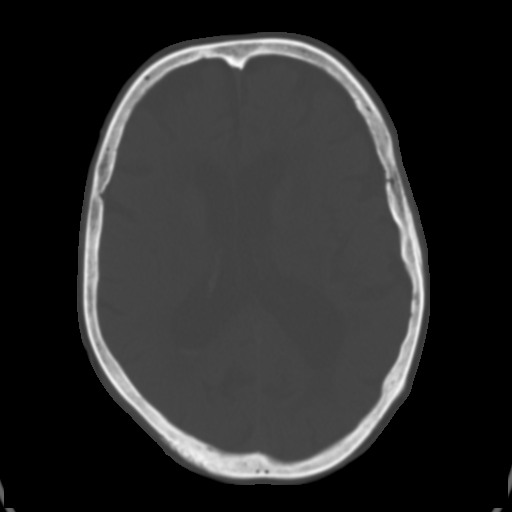
[im 19/32  brain]
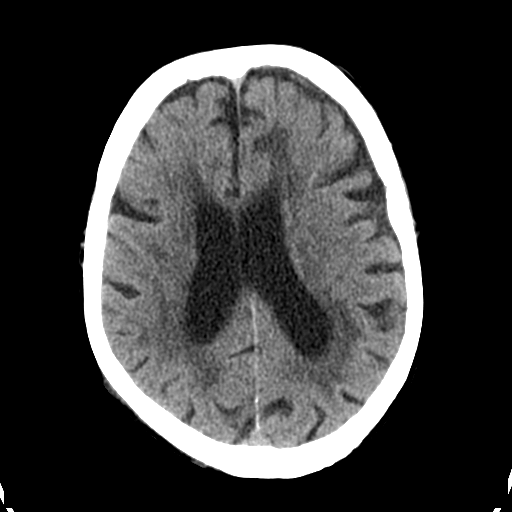
[im 23/32  brain]
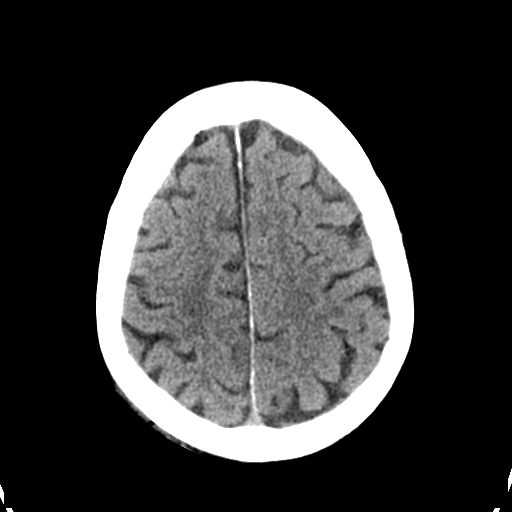
[im 25/32  brain]
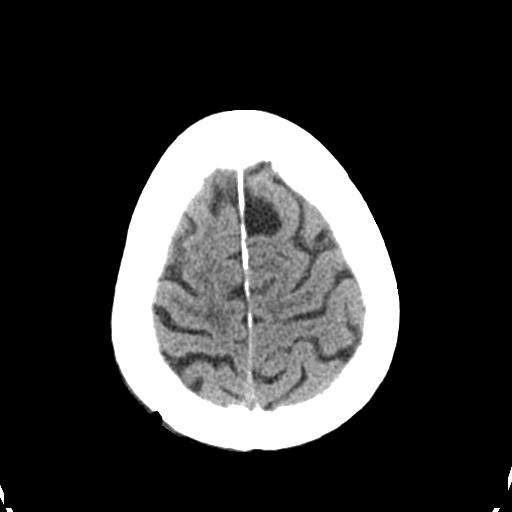
[im 29/32  brain]
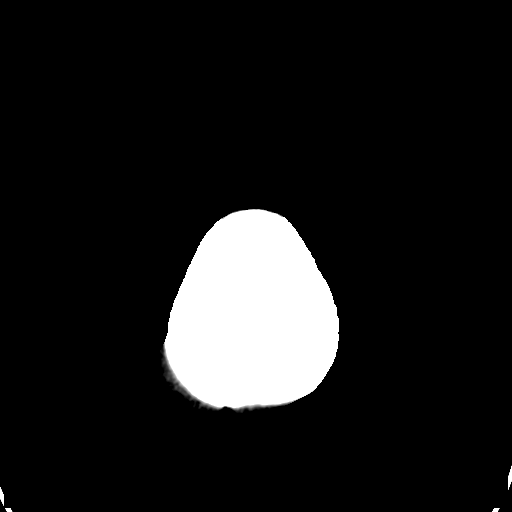
[im 29/32  bone]
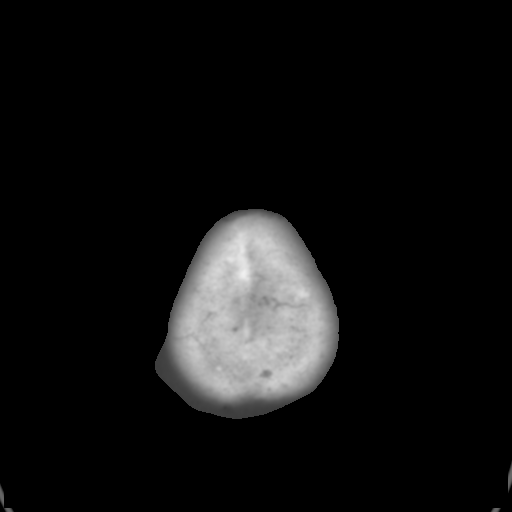

[Series 4: coronal soft tissue · coronal · 0.30mm/px · 3 of 62 slices shown]
[im 21/62  brain]
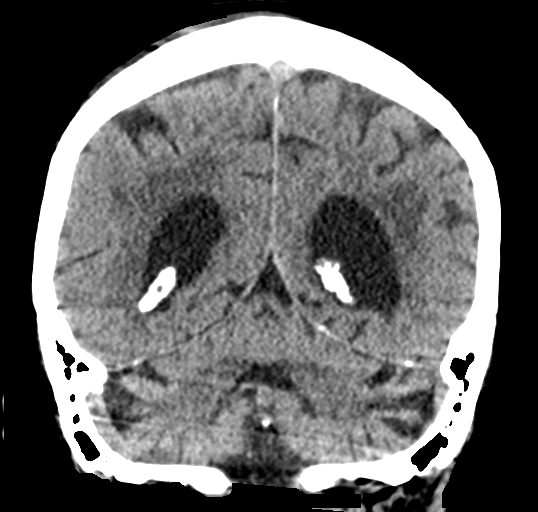
[im 28/62  brain]
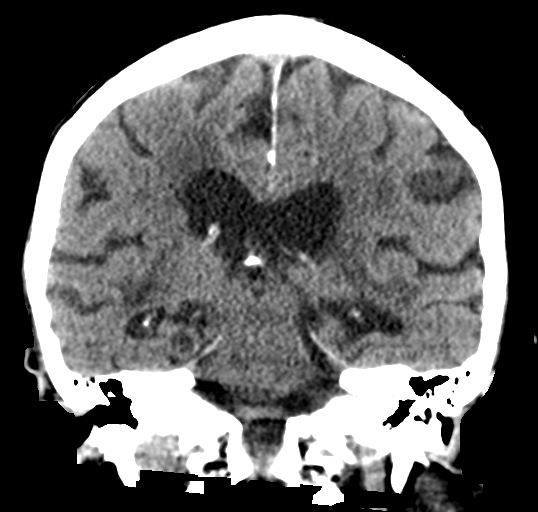
[im 34/62  brain]
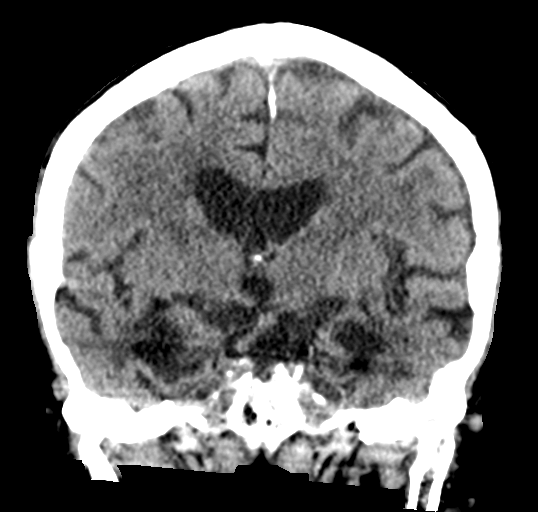

[Series 5: sagittal soft tissue · sagittal · 0.31mm/px · 3 of 49 slices shown]
[im 17/49  brain]
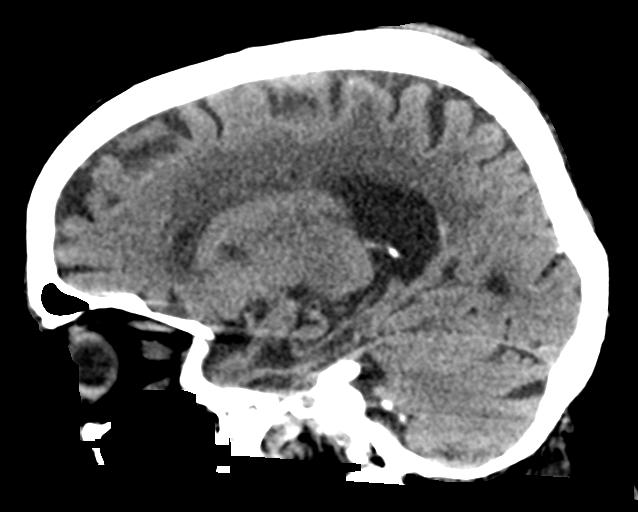
[im 25/49  brain]
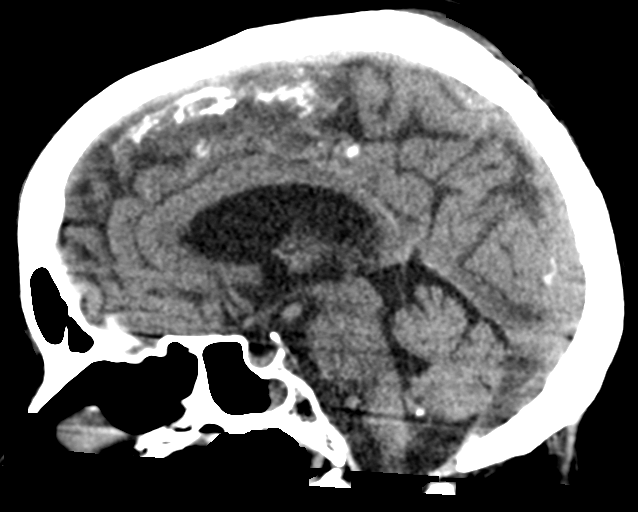
[im 33/49  brain]
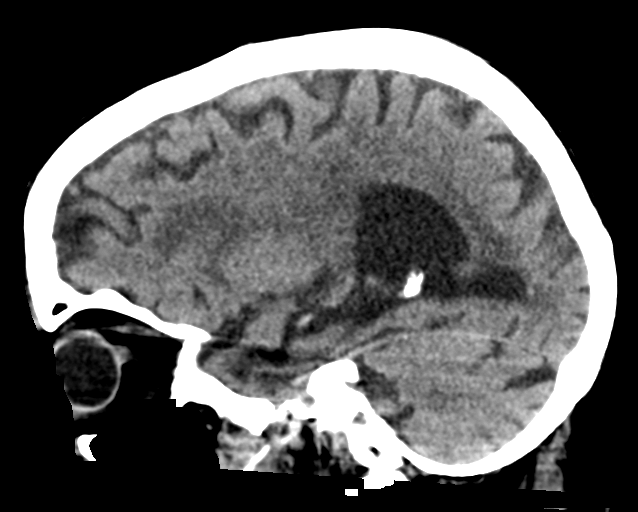

[15 of 47 positions shown; findings below may reference images not displayed]

FINDINGS: Brain: Generalized atrophy is stable. There remain areas of
hemorrhage in each posterior aspect left lateral ventricle,
unchanged. There has been no new or progression of hemorrhage since
earlier in the day. There is no mass. There is no extra-axial fluid
collection or midline shift. There is no appreciable
intraparenchymal hemorrhage. No well-defined hematoma evident.

There is stable periventricular small vessel disease in the centra
semiovale bilaterally. No acute infarct evident.

Vascular: No hyperdense vessel evident. There is calcification in
each carotid siphon region as well as slight calcification in distal
vertebral arteries.

Skull: Bony calvarium appears intact. There is a right parietal
scalp hematoma containing foci of air.

Sinuses/Orbits: There is mucosal thickening and opacification in
several ethmoid air cells. There is opacification in the posterior
left sphenoid sinus. Orbits appear symmetric bilaterally.

Other: Mastoid air cells are clear. There is debris in each external
auditory canal.
IMPRESSION: Layering hemorrhage noted in each posterior aspect of the lateral
ventricles, stable. No new or increasing foci of hemorrhage. No
parenchymal hemorrhage in particular noted.

Stable atrophy with periventricular small vessel disease. No acute
infarct evident. No extra-axial fluid collection or midline shift.

There are foci of arterial vascular calcification. There is a right
parietal scalp hematoma containing air. No fracture evident. Foci of
paranasal sinus disease noted. There is probable cerumen in each
external auditory canal.

## 2020-01-04 IMAGING — CT CT CERVICAL SPINE W/O CM
3 of 6 series · 12 of 33 positions shown, 14 images · non-contrast
Comparison: None.

CLINICAL DATA: Head laceration after fall today.

EXAM:
CT HEAD WITHOUT CONTRAST
CT CERVICAL SPINE WITHOUT CONTRAST
TECHNIQUE: Multidetector CT imaging of the head and cervical spine was
performed following the standard protocol without intravenous
contrast. Multiplanar CT image reconstructions of the cervical spine
were also generated.

[Series 5: coronal soft tissue · coronal · 0.32mm/px · 3 of 65 slices shown]
[im 13/65  bone]
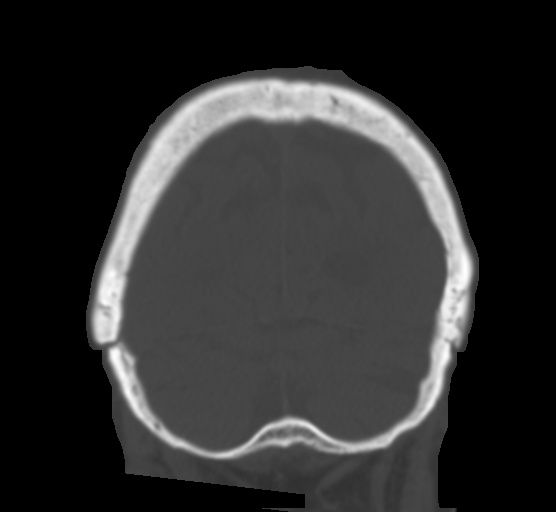
[im 26/65  bone]
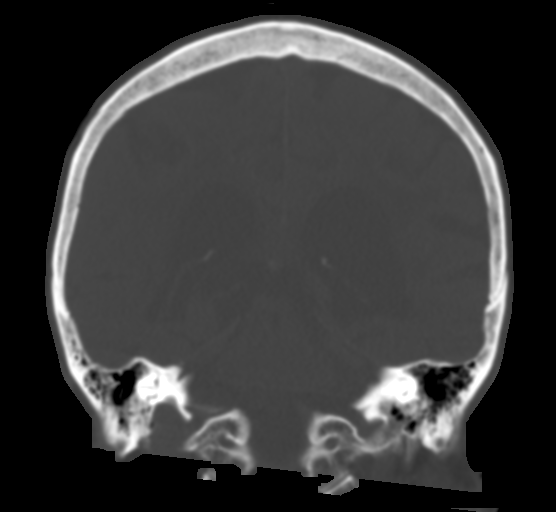
[im 39/65  bone]
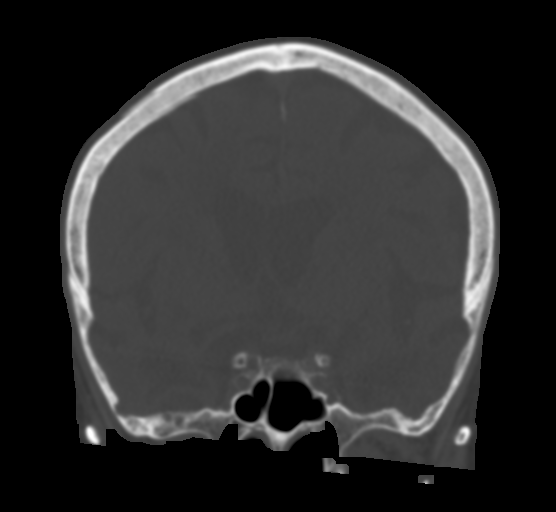

[Series 8: c spine soft · axial · 0.37mm/px · z∈[-178,-82]mm · 5 of 74 slices shown, 7 images]
[im 13/74  soft-tissue]
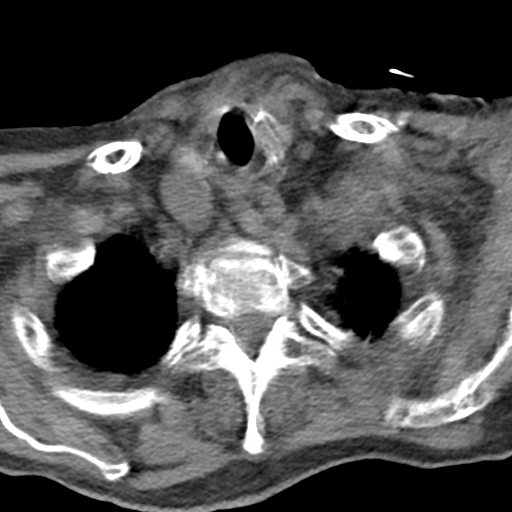
[im 13/74  bone]
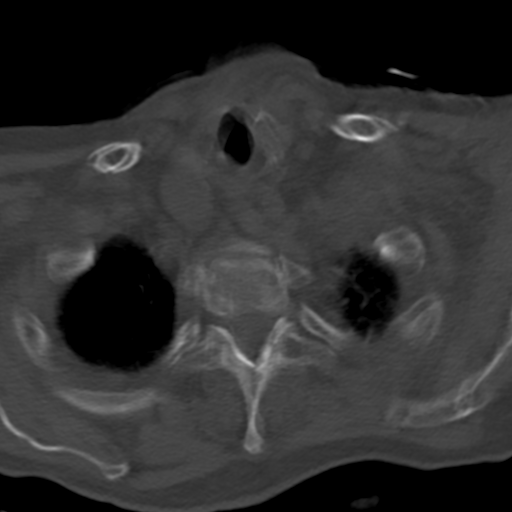
[im 25/74  bone]
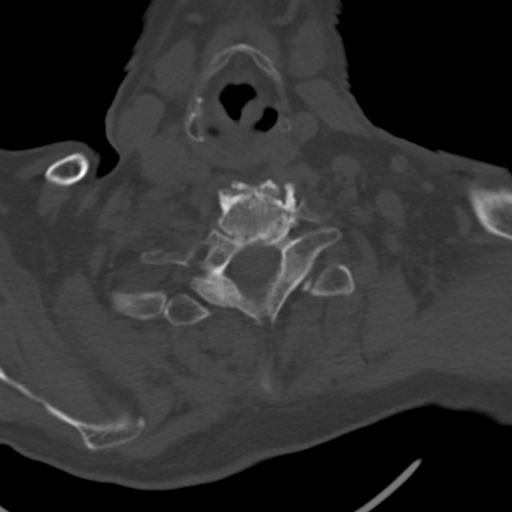
[im 37/74  bone]
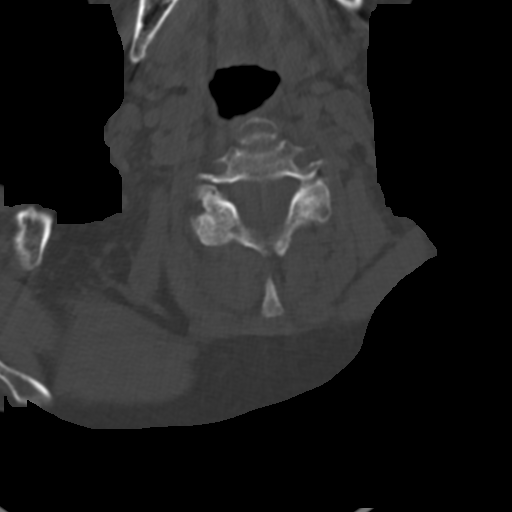
[im 49/74  bone]
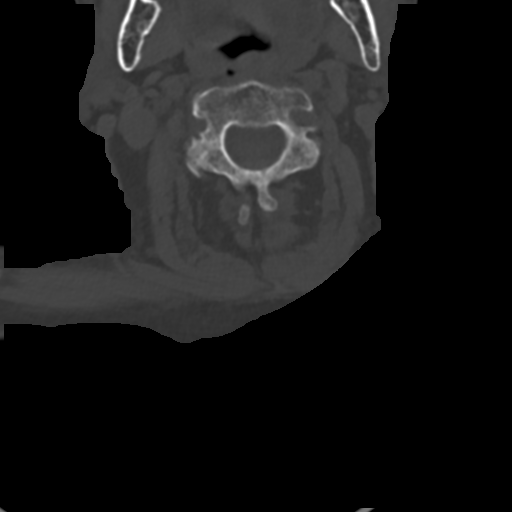
[im 61/74  soft-tissue]
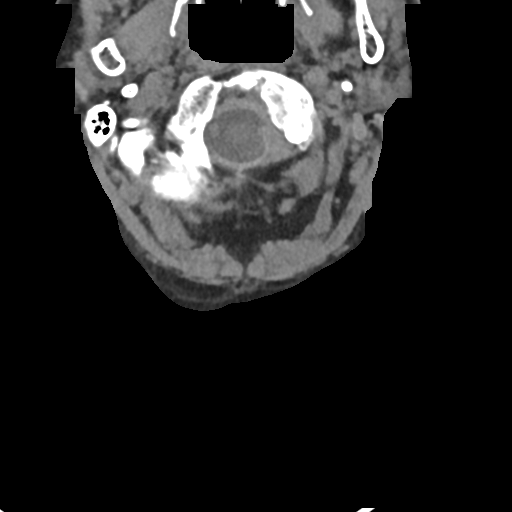
[im 61/74  bone]
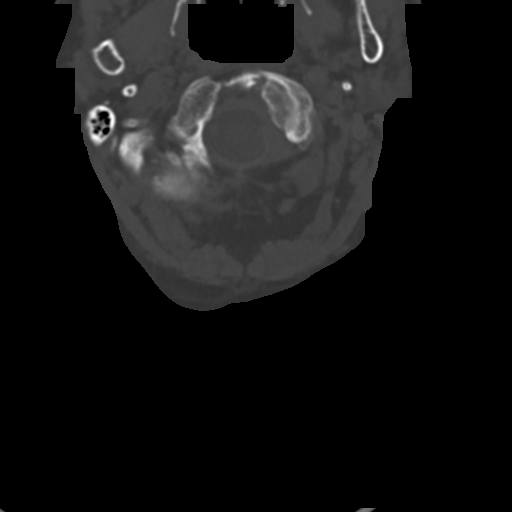

[Series 11: sagittal bone · sagittal · 0.21mm/px · 4 of 48 slices shown]
[im 10/48  bone]
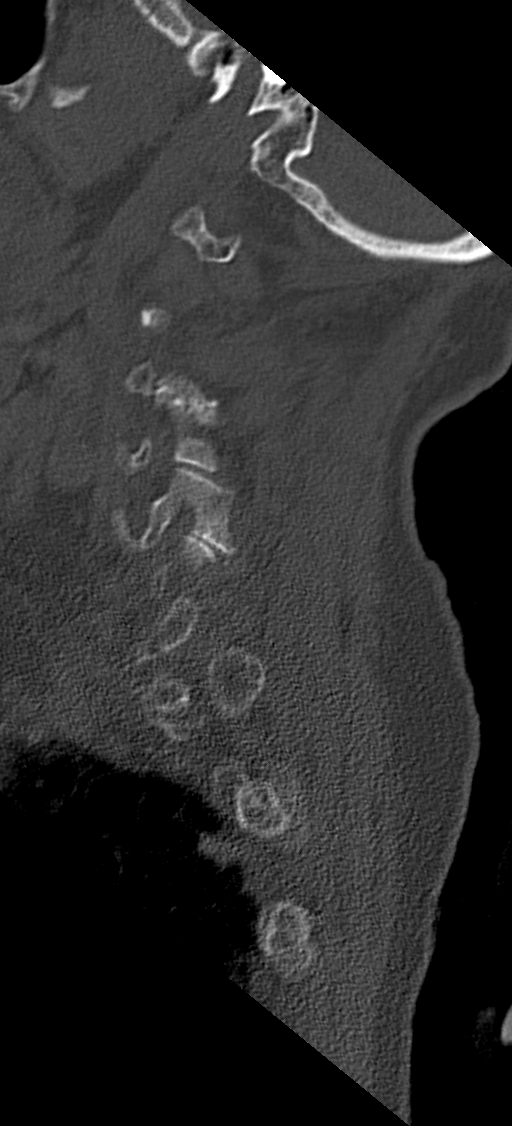
[im 19/48  bone]
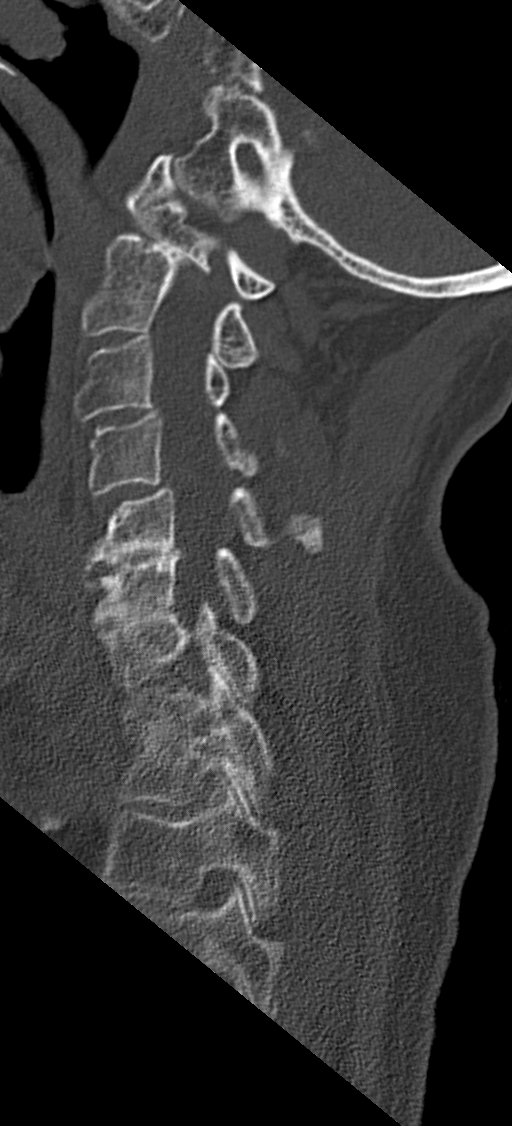
[im 29/48  bone]
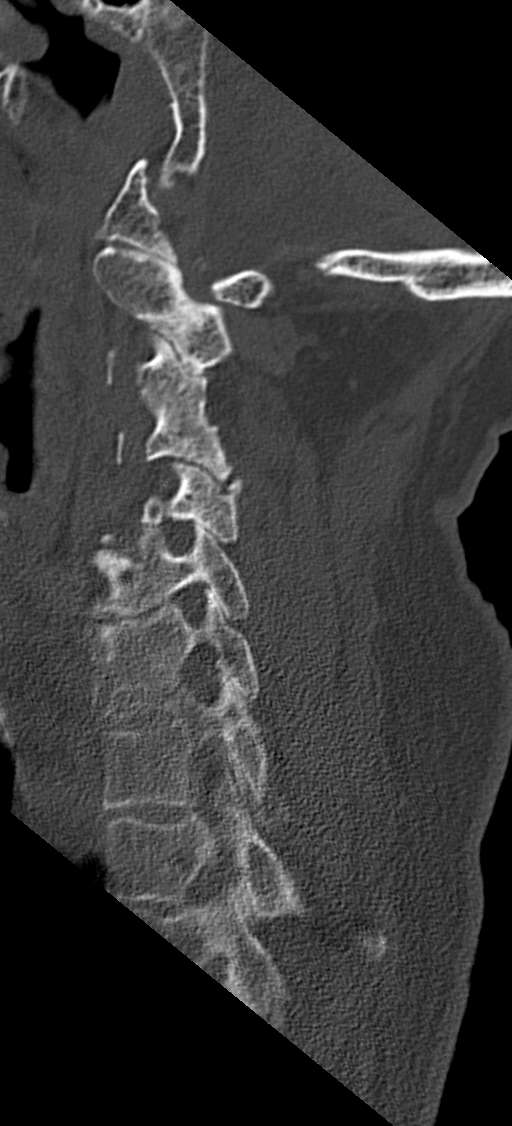
[im 38/48  bone]
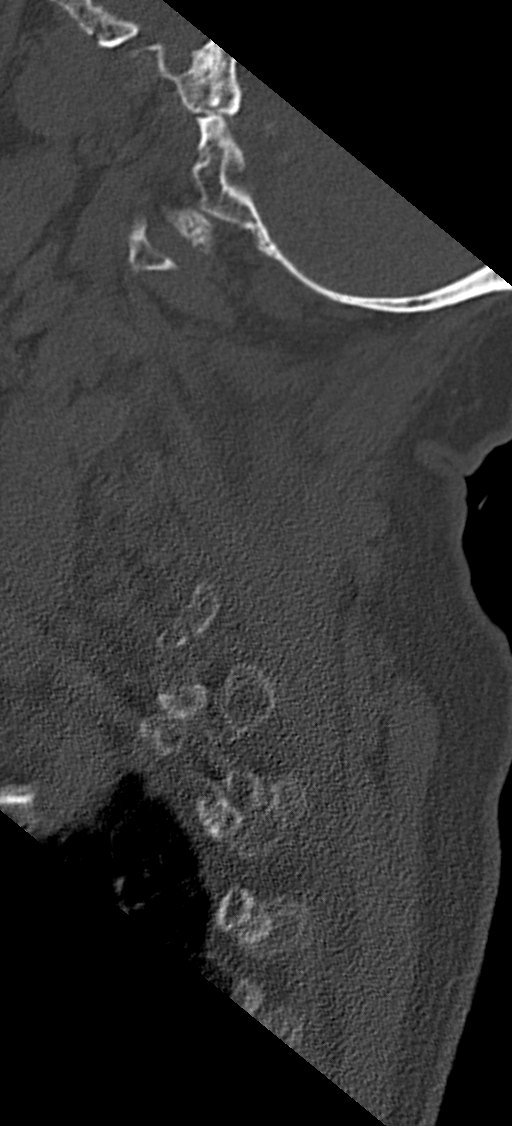

[12 of 33 positions shown; findings below may reference images not displayed]

FINDINGS: CT HEAD FINDINGS

Brain: Mild diffuse cortical atrophy is noted. Mild chronic ischemic
white matter disease is noted. Small amount of blood is noted in the
posterior horns of both lateral ventricles. No mass effect or
midline shift is noted. No acute infarction or mass lesion is noted.
Ventricular size is within normal limits given the degree of
surrounding atrophy.

Vascular: No hyperdense vessel or unexpected calcification.

Skull: Normal. Negative for fracture or focal lesion.

Sinuses/Orbits: Mild left sphenoid sinusitis is noted.

Other: Small right posterior scalp hematoma is noted.

CT CERVICAL SPINE FINDINGS

Alignment: Mild grade 1 anterolisthesis of C3-4 and C4-5 is noted
secondary to posterior facet joint hypertrophy.

Skull base and vertebrae: No acute fracture. No primary bone lesion
or focal pathologic process.

Soft tissues and spinal canal: No prevertebral fluid or swelling. No
visible canal hematoma.

Disc levels: Severe degenerative disc disease is noted at C5-6 and
C6-7 with anterior osteophyte formation.

Upper chest: Negative.

Other: Degenerative changes are seen involving posterior facet
joints bilaterally.
IMPRESSION: Mild diffuse cortical atrophy. Mild chronic ischemic white matter
disease. Small right posterior scalp hematoma is noted. Small amount
of intraventricular hemorrhage is noted in the posterior horns of
both lateral ventricles. Critical Value/emergent results were called
by telephone at the time of interpretation on 01/26/2018 at [DATE]
to Dr. FRANSHESCA SUSARTE , who verbally acknowledged these results.

Multilevel degenerative disc disease is noted in the cervical spine.
No fracture or other acute abnormality is noted.
